# Patient Record
Sex: Female | Born: 1980 | Race: Black or African American | Hispanic: No | Marital: Married | Smoking: Current every day smoker
Health system: Southern US, Community
[De-identification: ages and names within clinical notes are randomized; demographics above are authoritative.]

## PROBLEM LIST (undated history)

## (undated) DIAGNOSIS — M79601 Pain in right arm: Secondary | ICD-10-CM

## (undated) DIAGNOSIS — K219 Gastro-esophageal reflux disease without esophagitis: Secondary | ICD-10-CM

## (undated) DIAGNOSIS — R51 Headache: Secondary | ICD-10-CM

## (undated) DIAGNOSIS — M5410 Radiculopathy, site unspecified: Secondary | ICD-10-CM

## (undated) DIAGNOSIS — M5030 Other cervical disc degeneration, unspecified cervical region: Secondary | ICD-10-CM

---
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
## 2002-01-17 ENCOUNTER — Other Ambulatory Visit: Admission: RE | Admit: 2002-01-17 | Discharge: 2002-01-17 | Payer: Self-pay | Admitting: Obstetrics and Gynecology

## 2002-02-20 ENCOUNTER — Encounter: Payer: Self-pay | Admitting: Obstetrics and Gynecology

## 2002-02-20 ENCOUNTER — Inpatient Hospital Stay (HOSPITAL_COMMUNITY): Admission: AD | Admit: 2002-02-20 | Discharge: 2002-02-22 | Payer: Self-pay | Admitting: Obstetrics and Gynecology

## 2002-02-21 ENCOUNTER — Encounter (INDEPENDENT_AMBULATORY_CARE_PROVIDER_SITE_OTHER): Payer: Self-pay | Admitting: *Deleted

## 2002-04-06 ENCOUNTER — Other Ambulatory Visit: Admission: RE | Admit: 2002-04-06 | Discharge: 2002-04-06 | Payer: Self-pay | Admitting: Obstetrics and Gynecology

## 2002-04-16 ENCOUNTER — Encounter: Payer: Self-pay | Admitting: Emergency Medicine

## 2002-04-16 ENCOUNTER — Emergency Department (HOSPITAL_COMMUNITY): Admission: EM | Admit: 2002-04-16 | Discharge: 2002-04-16 | Payer: Self-pay | Admitting: Emergency Medicine

## 2002-06-11 ENCOUNTER — Encounter: Payer: Self-pay | Admitting: Obstetrics and Gynecology

## 2002-06-11 ENCOUNTER — Inpatient Hospital Stay (HOSPITAL_COMMUNITY): Admission: AD | Admit: 2002-06-11 | Discharge: 2002-06-11 | Payer: Self-pay | Admitting: Obstetrics and Gynecology

## 2002-06-12 ENCOUNTER — Ambulatory Visit (HOSPITAL_COMMUNITY): Admission: AD | Admit: 2002-06-12 | Discharge: 2002-06-12 | Payer: Self-pay | Admitting: Obstetrics and Gynecology

## 2002-06-12 ENCOUNTER — Encounter (INDEPENDENT_AMBULATORY_CARE_PROVIDER_SITE_OTHER): Payer: Self-pay | Admitting: Specialist

## 2002-07-25 ENCOUNTER — Emergency Department (HOSPITAL_COMMUNITY): Admission: EM | Admit: 2002-07-25 | Discharge: 2002-07-25 | Payer: Self-pay | Admitting: Emergency Medicine

## 2002-08-11 ENCOUNTER — Emergency Department (HOSPITAL_COMMUNITY): Admission: EM | Admit: 2002-08-11 | Discharge: 2002-08-12 | Payer: Self-pay | Admitting: Emergency Medicine

## 2002-09-18 ENCOUNTER — Emergency Department (HOSPITAL_COMMUNITY): Admission: EM | Admit: 2002-09-18 | Discharge: 2002-09-18 | Payer: Self-pay | Admitting: Emergency Medicine

## 2002-09-18 ENCOUNTER — Encounter: Payer: Self-pay | Admitting: Emergency Medicine

## 2002-12-09 ENCOUNTER — Inpatient Hospital Stay (HOSPITAL_COMMUNITY): Admission: AD | Admit: 2002-12-09 | Discharge: 2002-12-09 | Payer: Self-pay | Admitting: Obstetrics & Gynecology

## 2003-04-13 ENCOUNTER — Emergency Department (HOSPITAL_COMMUNITY): Admission: EM | Admit: 2003-04-13 | Discharge: 2003-04-13 | Payer: Self-pay | Admitting: Emergency Medicine

## 2003-04-17 ENCOUNTER — Inpatient Hospital Stay (HOSPITAL_COMMUNITY): Admission: AD | Admit: 2003-04-17 | Discharge: 2003-04-17 | Payer: Self-pay | Admitting: Obstetrics and Gynecology

## 2003-04-30 ENCOUNTER — Inpatient Hospital Stay (HOSPITAL_COMMUNITY): Admission: AD | Admit: 2003-04-30 | Discharge: 2003-04-30 | Payer: Self-pay | Admitting: Family Medicine

## 2003-05-29 ENCOUNTER — Emergency Department (HOSPITAL_COMMUNITY): Admission: EM | Admit: 2003-05-29 | Discharge: 2003-05-29 | Payer: Self-pay | Admitting: Emergency Medicine

## 2003-08-21 ENCOUNTER — Inpatient Hospital Stay (HOSPITAL_COMMUNITY): Admission: AD | Admit: 2003-08-21 | Discharge: 2003-08-22 | Payer: Self-pay | Admitting: Obstetrics and Gynecology

## 2003-08-27 ENCOUNTER — Emergency Department (HOSPITAL_COMMUNITY): Admission: EM | Admit: 2003-08-27 | Discharge: 2003-08-27 | Payer: Self-pay | Admitting: Emergency Medicine

## 2003-09-20 ENCOUNTER — Emergency Department (HOSPITAL_COMMUNITY): Admission: EM | Admit: 2003-09-20 | Discharge: 2003-09-20 | Payer: Self-pay | Admitting: Family Medicine

## 2003-10-21 ENCOUNTER — Emergency Department (HOSPITAL_COMMUNITY): Admission: EM | Admit: 2003-10-21 | Discharge: 2003-10-21 | Payer: Self-pay | Admitting: Family Medicine

## 2003-12-09 ENCOUNTER — Emergency Department (HOSPITAL_COMMUNITY): Admission: AD | Admit: 2003-12-09 | Discharge: 2003-12-09 | Payer: Self-pay | Admitting: Emergency Medicine

## 2003-12-24 ENCOUNTER — Emergency Department (HOSPITAL_COMMUNITY): Admission: EM | Admit: 2003-12-24 | Discharge: 2003-12-25 | Payer: Self-pay | Admitting: Emergency Medicine

## 2004-01-28 ENCOUNTER — Inpatient Hospital Stay (HOSPITAL_COMMUNITY): Admission: AD | Admit: 2004-01-28 | Discharge: 2004-01-28 | Payer: Self-pay | Admitting: *Deleted

## 2004-02-23 ENCOUNTER — Encounter (INDEPENDENT_AMBULATORY_CARE_PROVIDER_SITE_OTHER): Payer: Self-pay | Admitting: *Deleted

## 2004-03-02 ENCOUNTER — Ambulatory Visit: Payer: Self-pay | Admitting: Family Medicine

## 2004-06-24 ENCOUNTER — Inpatient Hospital Stay (HOSPITAL_COMMUNITY): Admission: AD | Admit: 2004-06-24 | Discharge: 2004-06-25 | Payer: Self-pay | Admitting: Obstetrics and Gynecology

## 2004-09-24 ENCOUNTER — Emergency Department (HOSPITAL_COMMUNITY): Admission: EM | Admit: 2004-09-24 | Discharge: 2004-09-25 | Payer: Self-pay | Admitting: Emergency Medicine

## 2004-10-27 ENCOUNTER — Emergency Department (HOSPITAL_COMMUNITY): Admission: EM | Admit: 2004-10-27 | Discharge: 2004-10-27 | Payer: Self-pay | Admitting: Family Medicine

## 2004-11-20 ENCOUNTER — Emergency Department (HOSPITAL_COMMUNITY): Admission: EM | Admit: 2004-11-20 | Discharge: 2004-11-20 | Payer: Self-pay | Admitting: Family Medicine

## 2005-01-01 ENCOUNTER — Emergency Department (HOSPITAL_COMMUNITY): Admission: EM | Admit: 2005-01-01 | Discharge: 2005-01-01 | Payer: Self-pay | Admitting: Emergency Medicine

## 2005-04-20 ENCOUNTER — Inpatient Hospital Stay (HOSPITAL_COMMUNITY): Admission: AD | Admit: 2005-04-20 | Discharge: 2005-04-20 | Payer: Self-pay | Admitting: Family Medicine

## 2005-07-09 ENCOUNTER — Other Ambulatory Visit: Admission: RE | Admit: 2005-07-09 | Discharge: 2005-07-09 | Payer: Self-pay | Admitting: Obstetrics and Gynecology

## 2005-07-27 ENCOUNTER — Emergency Department (HOSPITAL_COMMUNITY): Admission: EM | Admit: 2005-07-27 | Discharge: 2005-07-27 | Payer: Self-pay | Admitting: Family Medicine

## 2005-10-22 ENCOUNTER — Emergency Department (HOSPITAL_COMMUNITY): Admission: EM | Admit: 2005-10-22 | Discharge: 2005-10-22 | Payer: Self-pay | Admitting: Family Medicine

## 2006-01-01 ENCOUNTER — Emergency Department (HOSPITAL_COMMUNITY): Admission: EM | Admit: 2006-01-01 | Discharge: 2006-01-01 | Payer: Self-pay | Admitting: Family Medicine

## 2006-04-22 ENCOUNTER — Encounter (INDEPENDENT_AMBULATORY_CARE_PROVIDER_SITE_OTHER): Payer: Self-pay | Admitting: *Deleted

## 2006-09-30 ENCOUNTER — Emergency Department (HOSPITAL_COMMUNITY): Admission: EM | Admit: 2006-09-30 | Discharge: 2006-09-30 | Payer: Self-pay | Admitting: Family Medicine

## 2006-10-28 ENCOUNTER — Emergency Department (HOSPITAL_COMMUNITY): Admission: EM | Admit: 2006-10-28 | Discharge: 2006-10-28 | Payer: Self-pay | Admitting: Family Medicine

## 2007-04-24 ENCOUNTER — Ambulatory Visit: Payer: Self-pay | Admitting: Obstetrics & Gynecology

## 2007-04-24 ENCOUNTER — Inpatient Hospital Stay (HOSPITAL_COMMUNITY): Admission: AD | Admit: 2007-04-24 | Discharge: 2007-04-24 | Payer: Self-pay | Admitting: Obstetrics & Gynecology

## 2007-04-26 ENCOUNTER — Inpatient Hospital Stay (HOSPITAL_COMMUNITY): Admission: AD | Admit: 2007-04-26 | Discharge: 2007-04-26 | Payer: Self-pay | Admitting: Obstetrics & Gynecology

## 2007-05-03 ENCOUNTER — Inpatient Hospital Stay (HOSPITAL_COMMUNITY): Admission: RE | Admit: 2007-05-03 | Discharge: 2007-05-03 | Payer: Self-pay | Admitting: Obstetrics & Gynecology

## 2007-05-05 ENCOUNTER — Emergency Department (HOSPITAL_COMMUNITY): Admission: EM | Admit: 2007-05-05 | Discharge: 2007-05-05 | Payer: Self-pay | Admitting: Emergency Medicine

## 2007-05-05 ENCOUNTER — Inpatient Hospital Stay (HOSPITAL_COMMUNITY): Admission: AD | Admit: 2007-05-05 | Discharge: 2007-05-05 | Payer: Self-pay | Admitting: Obstetrics & Gynecology

## 2007-05-18 ENCOUNTER — Inpatient Hospital Stay (HOSPITAL_COMMUNITY): Admission: AD | Admit: 2007-05-18 | Discharge: 2007-05-19 | Payer: Self-pay | Admitting: Obstetrics & Gynecology

## 2007-06-15 ENCOUNTER — Ambulatory Visit (HOSPITAL_COMMUNITY): Admission: RE | Admit: 2007-06-15 | Discharge: 2007-06-15 | Payer: Self-pay | Admitting: Family Medicine

## 2007-06-15 ENCOUNTER — Ambulatory Visit: Payer: Self-pay | Admitting: Family Medicine

## 2007-06-29 ENCOUNTER — Ambulatory Visit: Payer: Self-pay | Admitting: Obstetrics & Gynecology

## 2007-07-13 ENCOUNTER — Ambulatory Visit (HOSPITAL_COMMUNITY): Admission: RE | Admit: 2007-07-13 | Discharge: 2007-07-13 | Payer: Self-pay

## 2007-07-13 ENCOUNTER — Ambulatory Visit: Payer: Self-pay | Admitting: *Deleted

## 2007-07-27 ENCOUNTER — Ambulatory Visit (HOSPITAL_COMMUNITY): Admission: RE | Admit: 2007-07-27 | Discharge: 2007-07-27 | Payer: Self-pay | Admitting: Family Medicine

## 2007-08-10 ENCOUNTER — Ambulatory Visit: Payer: Self-pay | Admitting: Obstetrics & Gynecology

## 2007-09-07 ENCOUNTER — Ambulatory Visit: Payer: Self-pay | Admitting: Obstetrics & Gynecology

## 2007-09-16 ENCOUNTER — Ambulatory Visit: Payer: Self-pay | Admitting: Advanced Practice Midwife

## 2007-09-16 ENCOUNTER — Inpatient Hospital Stay (HOSPITAL_COMMUNITY): Admission: AD | Admit: 2007-09-16 | Discharge: 2007-09-16 | Payer: Self-pay | Admitting: Family Medicine

## 2007-09-21 ENCOUNTER — Ambulatory Visit: Payer: Self-pay | Admitting: *Deleted

## 2007-10-05 ENCOUNTER — Ambulatory Visit: Payer: Self-pay | Admitting: Family Medicine

## 2007-10-06 ENCOUNTER — Ambulatory Visit (HOSPITAL_COMMUNITY): Admission: RE | Admit: 2007-10-06 | Discharge: 2007-10-06 | Payer: Self-pay | Admitting: Family Medicine

## 2007-10-13 ENCOUNTER — Ambulatory Visit (HOSPITAL_COMMUNITY): Admission: RE | Admit: 2007-10-13 | Discharge: 2007-10-13 | Payer: Self-pay | Admitting: Obstetrics & Gynecology

## 2007-10-19 ENCOUNTER — Ambulatory Visit: Payer: Self-pay | Admitting: Obstetrics & Gynecology

## 2007-10-26 ENCOUNTER — Ambulatory Visit (HOSPITAL_COMMUNITY): Admission: RE | Admit: 2007-10-26 | Discharge: 2007-10-26 | Payer: Self-pay | Admitting: Obstetrics & Gynecology

## 2007-10-26 ENCOUNTER — Ambulatory Visit: Payer: Self-pay | Admitting: Gynecology

## 2007-11-02 ENCOUNTER — Ambulatory Visit: Payer: Self-pay | Admitting: Obstetrics & Gynecology

## 2007-11-06 ENCOUNTER — Ambulatory Visit: Payer: Self-pay | Admitting: Gynecology

## 2007-11-09 ENCOUNTER — Ambulatory Visit: Payer: Self-pay | Admitting: Obstetrics & Gynecology

## 2007-11-13 ENCOUNTER — Ambulatory Visit: Payer: Self-pay | Admitting: Family Medicine

## 2007-11-16 ENCOUNTER — Ambulatory Visit: Payer: Self-pay | Admitting: Obstetrics & Gynecology

## 2007-11-20 ENCOUNTER — Ambulatory Visit: Payer: Self-pay | Admitting: Gynecology

## 2007-11-23 ENCOUNTER — Ambulatory Visit (HOSPITAL_COMMUNITY): Admission: RE | Admit: 2007-11-23 | Discharge: 2007-11-23 | Payer: Self-pay | Admitting: Obstetrics & Gynecology

## 2007-11-23 ENCOUNTER — Ambulatory Visit: Payer: Self-pay | Admitting: Obstetrics & Gynecology

## 2007-11-27 ENCOUNTER — Ambulatory Visit: Payer: Self-pay | Admitting: Obstetrics & Gynecology

## 2007-11-30 ENCOUNTER — Ambulatory Visit: Payer: Self-pay | Admitting: Obstetrics & Gynecology

## 2007-12-04 ENCOUNTER — Inpatient Hospital Stay (HOSPITAL_COMMUNITY): Admission: AD | Admit: 2007-12-04 | Discharge: 2007-12-10 | Payer: Self-pay | Admitting: Family Medicine

## 2007-12-04 ENCOUNTER — Ambulatory Visit: Payer: Self-pay | Admitting: Family Medicine

## 2007-12-04 ENCOUNTER — Ambulatory Visit: Payer: Self-pay | Admitting: Obstetrics & Gynecology

## 2007-12-05 ENCOUNTER — Encounter: Payer: Self-pay | Admitting: *Deleted

## 2007-12-06 ENCOUNTER — Encounter: Payer: Self-pay | Admitting: *Deleted

## 2007-12-07 ENCOUNTER — Ambulatory Visit: Payer: Self-pay | Admitting: Obstetrics and Gynecology

## 2007-12-11 ENCOUNTER — Inpatient Hospital Stay (HOSPITAL_COMMUNITY): Admission: AD | Admit: 2007-12-11 | Discharge: 2007-12-11 | Payer: Self-pay | Admitting: Obstetrics & Gynecology

## 2008-04-09 ENCOUNTER — Emergency Department (HOSPITAL_COMMUNITY): Admission: EM | Admit: 2008-04-09 | Discharge: 2008-04-09 | Payer: Self-pay | Admitting: Emergency Medicine

## 2008-07-27 ENCOUNTER — Emergency Department (HOSPITAL_COMMUNITY): Admission: EM | Admit: 2008-07-27 | Discharge: 2008-07-27 | Payer: Self-pay | Admitting: Emergency Medicine

## 2008-07-31 ENCOUNTER — Emergency Department (HOSPITAL_COMMUNITY): Admission: EM | Admit: 2008-07-31 | Discharge: 2008-07-31 | Payer: Self-pay | Admitting: Family Medicine

## 2008-10-11 ENCOUNTER — Emergency Department (HOSPITAL_COMMUNITY): Admission: EM | Admit: 2008-10-11 | Discharge: 2008-10-11 | Payer: Self-pay | Admitting: Emergency Medicine

## 2008-10-30 ENCOUNTER — Emergency Department (HOSPITAL_COMMUNITY): Admission: EM | Admit: 2008-10-30 | Discharge: 2008-10-30 | Payer: Self-pay | Admitting: Radiology

## 2009-02-27 ENCOUNTER — Emergency Department (HOSPITAL_COMMUNITY): Admission: EM | Admit: 2009-02-27 | Discharge: 2009-02-27 | Payer: Self-pay | Admitting: Emergency Medicine

## 2009-04-19 ENCOUNTER — Emergency Department (HOSPITAL_COMMUNITY): Admission: EM | Admit: 2009-04-19 | Discharge: 2009-04-19 | Payer: Self-pay | Admitting: Family Medicine

## 2009-06-08 ENCOUNTER — Emergency Department (HOSPITAL_COMMUNITY): Admission: EM | Admit: 2009-06-08 | Discharge: 2009-06-08 | Payer: Self-pay | Admitting: Emergency Medicine

## 2009-06-16 ENCOUNTER — Emergency Department (HOSPITAL_COMMUNITY): Admission: EM | Admit: 2009-06-16 | Discharge: 2009-06-16 | Payer: Self-pay | Admitting: Emergency Medicine

## 2009-09-29 IMAGING — US US OB TRANSVAGINAL
1 series · 14 of 18 positions shown · non-contrast
Comparison: none

CLINICAL DATA: 27-year-old female with 8-week gestation - vaginal
bleeding.

TRANSVAGINAL OB US
TECHNIQUE: transvaginal ultrasound examinations performed for
complete evaluation of the gestation as well as the maternal
uterus, adnexal regions, and pelvic cul-de-sac.

[Series 1: us ob transvaginal · 0.18mm/px · 14 of 18 slices shown]
[im 1/18]
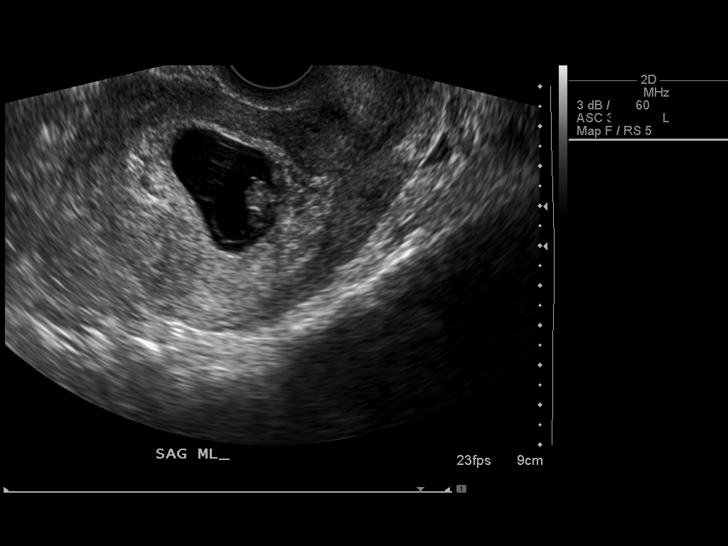
[im 2/18]
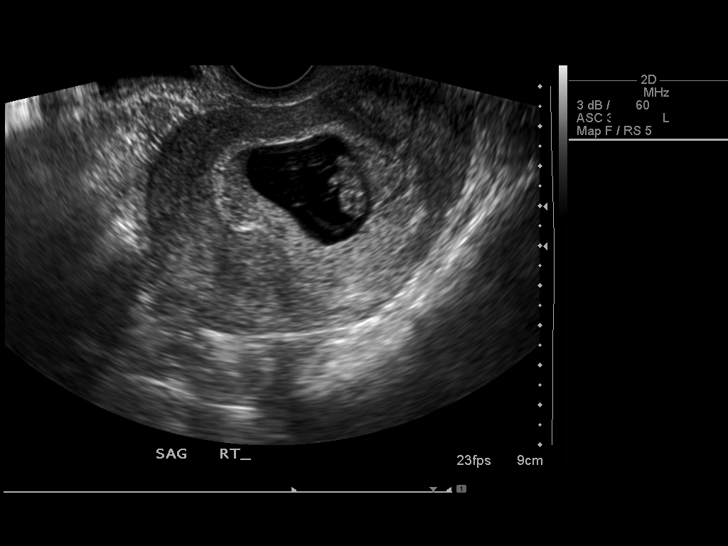
[im 4/18]
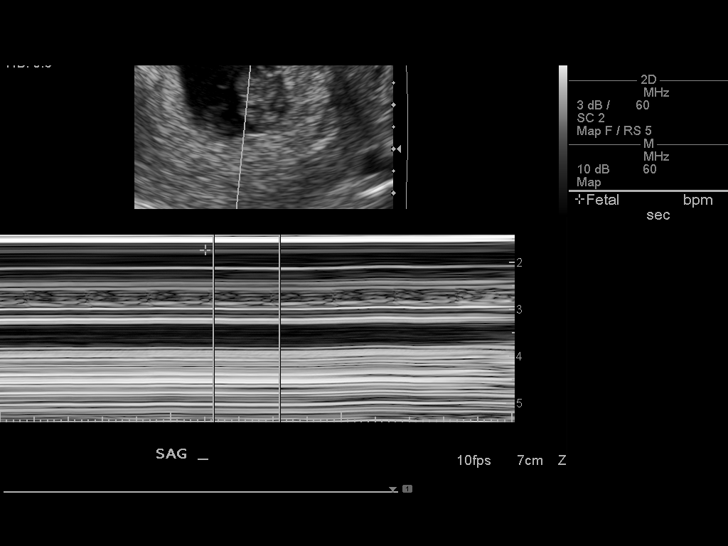
[im 5/18]
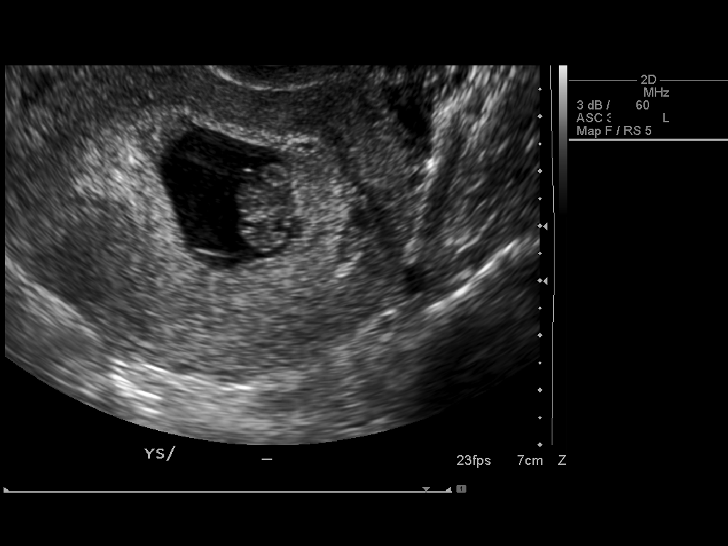
[im 6/18]
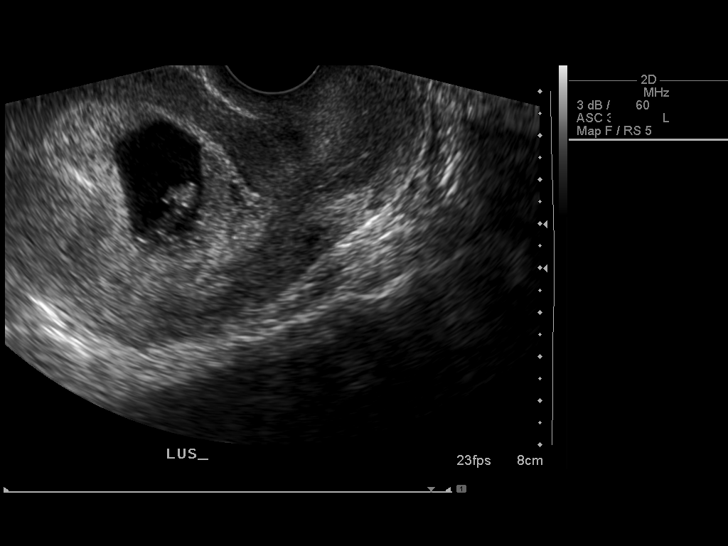
[im 8/18]
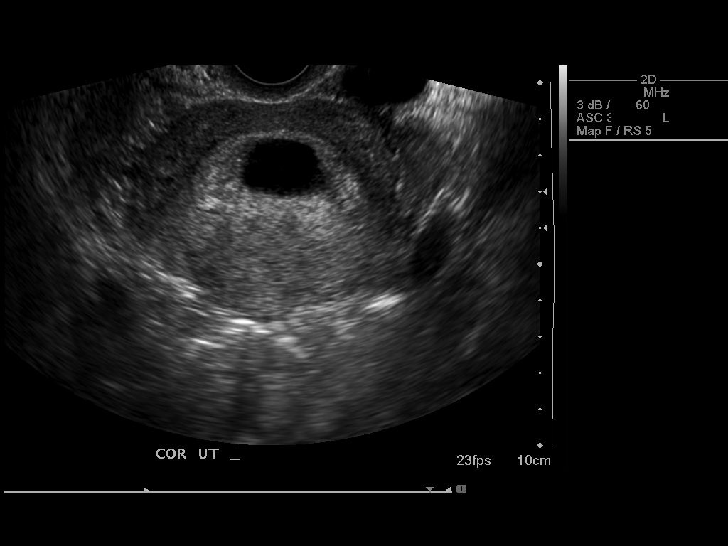
[im 9/18]
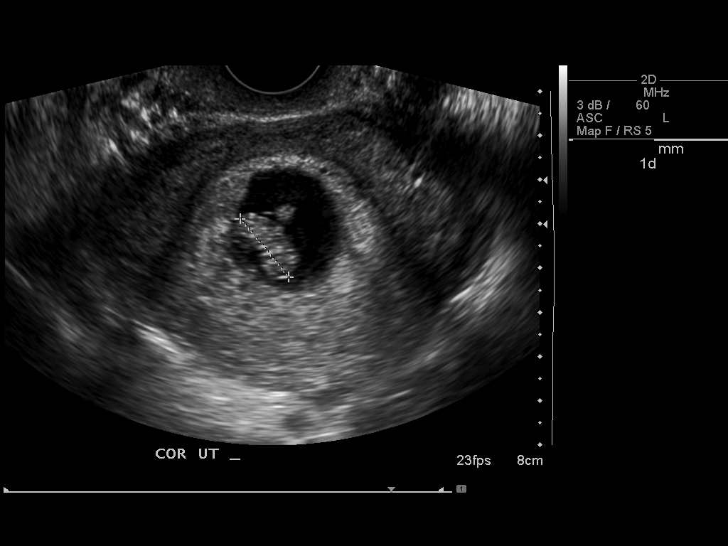
[im 10/18]
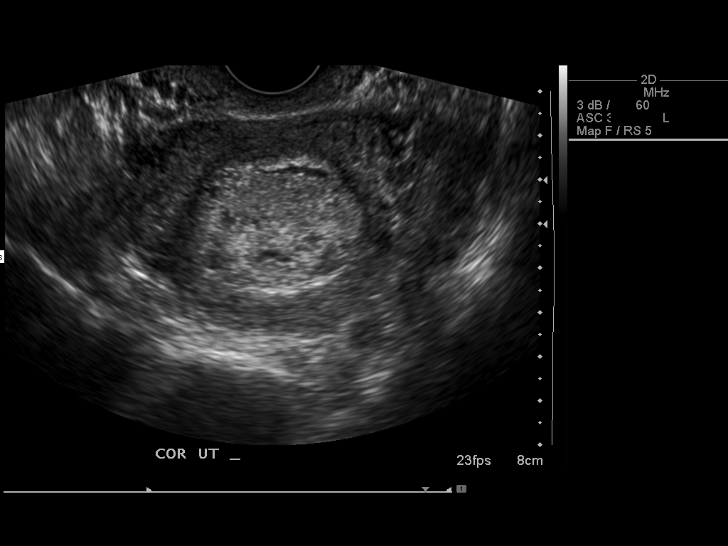
[im 11/18]
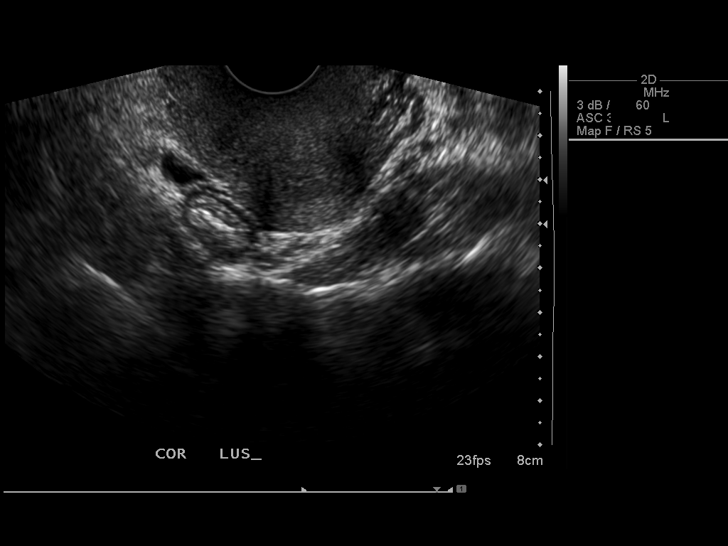
[im 13/18]
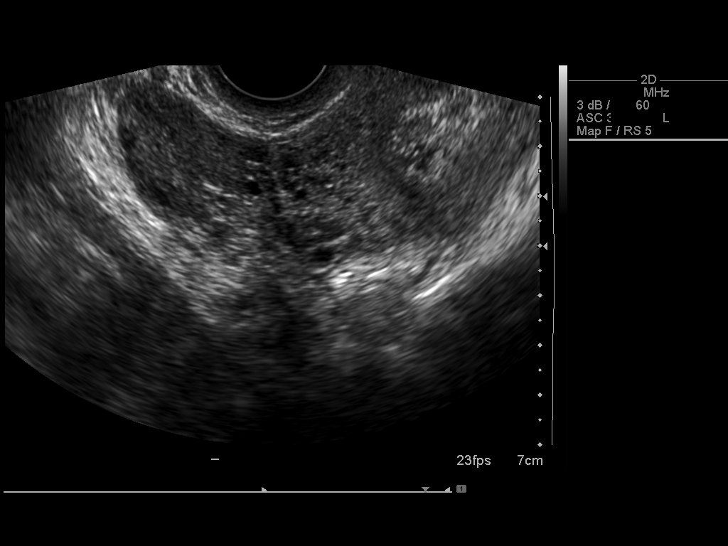
[im 14/18]
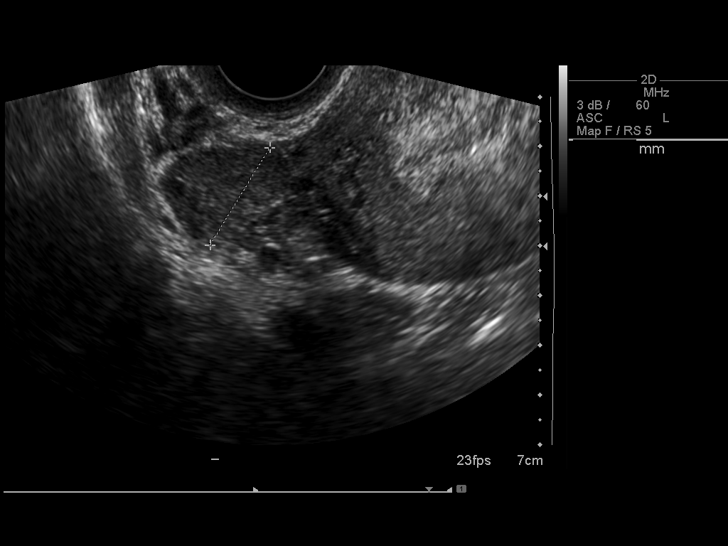
[im 15/18]
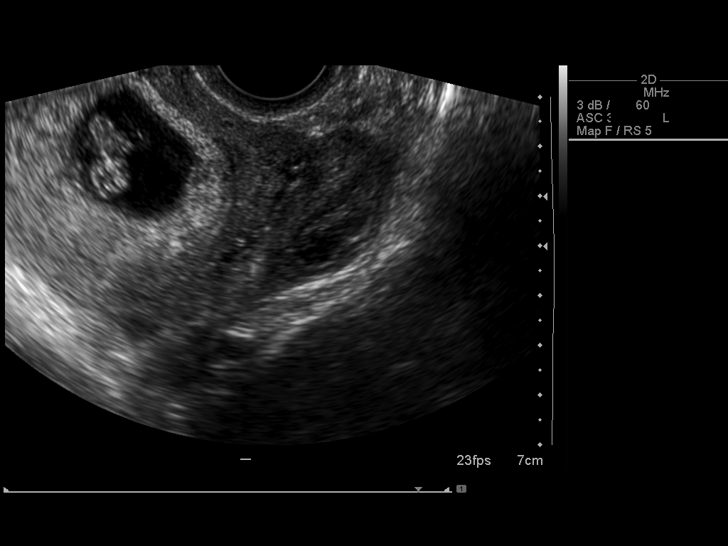
[im 17/18]
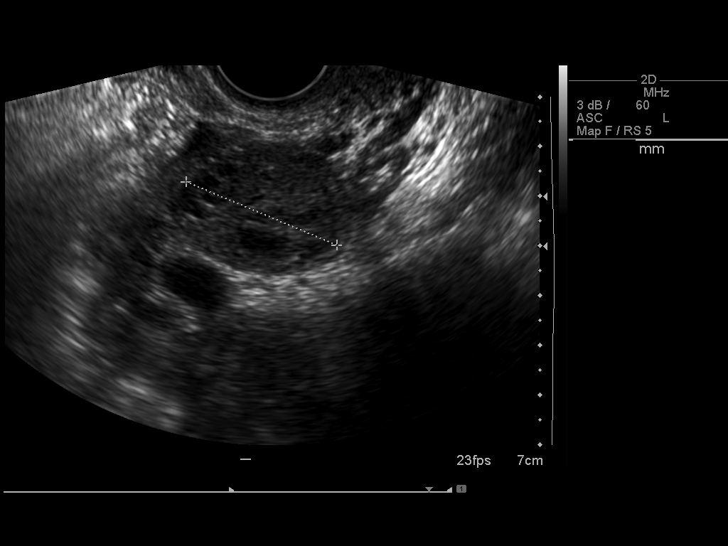
[im 18/18]
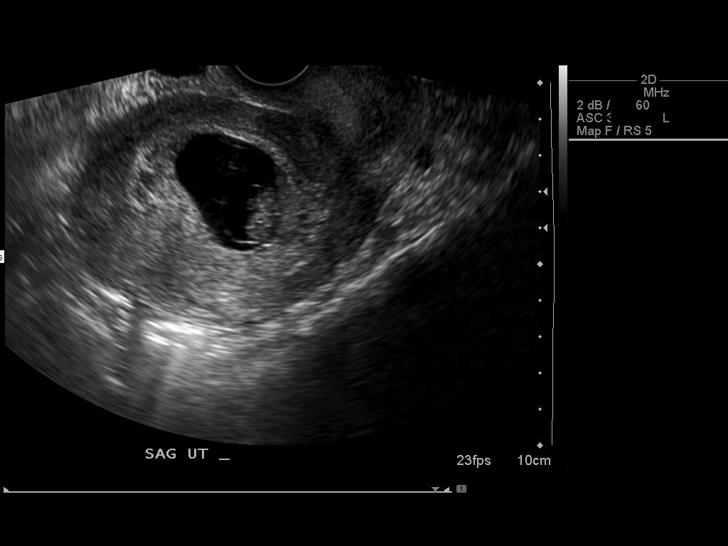

[14 of 18 positions shown; findings below may reference images not displayed]

Intrauterine gestational sac: Single
Yolk sac: Yes
Embryo: Yes
Cardiac Activity: Yes
Heart Rate: 155 bpm

CRL: 17.1 mm           8   w  1   d       - appropriate growth
since 05/03/2007.

No evidence of subchorionic hemorrhage.

Maternal uterus/adnexae:
The ovaries are unremarkable.

No evidence of adnexal mass or free fluid
IMPRESSION: Single living intrauterine gestation with estimated gestational age
of 8 weeks 1 day by this ultrasound - appropriate growth since the
prior study.

## 2009-11-01 ENCOUNTER — Ambulatory Visit: Payer: Self-pay | Admitting: Nurse Practitioner

## 2009-11-01 ENCOUNTER — Inpatient Hospital Stay (HOSPITAL_COMMUNITY): Admission: AD | Admit: 2009-11-01 | Discharge: 2009-11-01 | Payer: Self-pay | Admitting: Obstetrics & Gynecology

## 2009-12-05 ENCOUNTER — Inpatient Hospital Stay (HOSPITAL_COMMUNITY): Admission: AD | Admit: 2009-12-05 | Discharge: 2009-12-05 | Payer: Self-pay | Admitting: Obstetrics & Gynecology

## 2009-12-05 ENCOUNTER — Ambulatory Visit: Payer: Self-pay | Admitting: Obstetrics & Gynecology

## 2009-12-11 ENCOUNTER — Ambulatory Visit (HOSPITAL_COMMUNITY): Admission: RE | Admit: 2009-12-11 | Discharge: 2009-12-11 | Payer: Self-pay | Admitting: Family Medicine

## 2009-12-11 ENCOUNTER — Encounter: Payer: Self-pay | Admitting: Family Medicine

## 2009-12-25 ENCOUNTER — Ambulatory Visit: Payer: Self-pay | Admitting: Family Medicine

## 2010-01-01 ENCOUNTER — Ambulatory Visit (HOSPITAL_COMMUNITY)
Admission: RE | Admit: 2010-01-01 | Discharge: 2010-01-01 | Payer: Self-pay | Source: Home / Self Care | Admitting: Family Medicine

## 2010-01-01 ENCOUNTER — Ambulatory Visit: Payer: Self-pay | Admitting: Obstetrics & Gynecology

## 2010-01-19 ENCOUNTER — Ambulatory Visit (HOSPITAL_COMMUNITY)
Admission: RE | Admit: 2010-01-19 | Discharge: 2010-01-19 | Payer: Self-pay | Source: Home / Self Care | Admitting: Obstetrics & Gynecology

## 2010-01-29 ENCOUNTER — Ambulatory Visit: Payer: Self-pay | Admitting: Obstetrics & Gynecology

## 2010-02-18 ENCOUNTER — Ambulatory Visit (HOSPITAL_COMMUNITY)
Admission: RE | Admit: 2010-02-18 | Discharge: 2010-02-18 | Payer: Self-pay | Source: Home / Self Care | Attending: Obstetrics & Gynecology | Admitting: Obstetrics & Gynecology

## 2010-02-26 ENCOUNTER — Ambulatory Visit
Admission: RE | Admit: 2010-02-26 | Discharge: 2010-02-26 | Payer: Self-pay | Source: Home / Self Care | Attending: Obstetrics & Gynecology | Admitting: Obstetrics & Gynecology

## 2010-02-26 LAB — POCT URINALYSIS DIPSTICK
Bilirubin Urine: NEGATIVE
Hemoglobin, Urine: NEGATIVE
Ketones, ur: NEGATIVE mg/dL
Nitrite: NEGATIVE
Protein, ur: 30 mg/dL — AB
Specific Gravity, Urine: >=1.03 (ref 1.005–1.030)
Urine Glucose, Fasting: NEGATIVE mg/dL
Urobilinogen, UA: 1 mg/dL (ref 0.0–1.0)
pH: 6.5 (ref 5.0–8.0)

## 2010-03-15 ENCOUNTER — Encounter: Payer: Self-pay | Admitting: *Deleted

## 2010-03-19 ENCOUNTER — Ambulatory Visit
Admission: RE | Admit: 2010-03-19 | Discharge: 2010-03-19 | Payer: Self-pay | Source: Home / Self Care | Attending: Obstetrics and Gynecology | Admitting: Obstetrics and Gynecology

## 2010-03-19 ENCOUNTER — Encounter: Payer: Self-pay | Admitting: Obstetrics & Gynecology

## 2010-03-19 LAB — CONVERTED CEMR LAB
HCT: 36 % (ref 36.0–46.0)
MCV: 97.8 fL (ref 78.0–100.0)
Platelets: 293 10*3/uL (ref 150–400)
WBC: 11.6 10*3/uL — ABNORMAL HIGH (ref 4.0–10.5)

## 2010-03-19 LAB — POCT URINALYSIS DIPSTICK
Hgb urine dipstick: NEGATIVE
Specific Gravity, Urine: 1.02 (ref 1.005–1.030)
Urine Glucose, Fasting: NEGATIVE mg/dL
Urobilinogen, UA: 0.2 mg/dL (ref 0.0–1.0)

## 2010-04-02 ENCOUNTER — Other Ambulatory Visit: Payer: Self-pay

## 2010-04-02 DIAGNOSIS — J45909 Unspecified asthma, uncomplicated: Secondary | ICD-10-CM

## 2010-04-02 DIAGNOSIS — O09299 Supervision of pregnancy with other poor reproductive or obstetric history, unspecified trimester: Secondary | ICD-10-CM

## 2010-04-02 DIAGNOSIS — O99330 Smoking (tobacco) complicating pregnancy, unspecified trimester: Secondary | ICD-10-CM

## 2010-04-02 LAB — POCT URINALYSIS DIPSTICK
Bilirubin Urine: NEGATIVE
Hgb urine dipstick: NEGATIVE
Ketones, ur: NEGATIVE mg/dL
Nitrite: NEGATIVE
pH: 6 (ref 5.0–8.0)

## 2010-04-16 ENCOUNTER — Other Ambulatory Visit: Payer: Self-pay | Admitting: Obstetrics & Gynecology

## 2010-04-16 ENCOUNTER — Other Ambulatory Visit: Payer: Self-pay

## 2010-04-16 DIAGNOSIS — O09299 Supervision of pregnancy with other poor reproductive or obstetric history, unspecified trimester: Secondary | ICD-10-CM

## 2010-04-16 LAB — POCT URINALYSIS DIPSTICK
Hgb urine dipstick: NEGATIVE
Ketones, ur: NEGATIVE mg/dL
Protein, ur: NEGATIVE mg/dL
Specific Gravity, Urine: 1.025 (ref 1.005–1.030)
pH: 6.5 (ref 5.0–8.0)

## 2010-04-17 ENCOUNTER — Ambulatory Visit (HOSPITAL_COMMUNITY)
Admission: RE | Admit: 2010-04-17 | Discharge: 2010-04-17 | Disposition: A | Discharge status: Home / Self Care | Payer: Medicaid Other | Source: Ambulatory Visit | Attending: Obstetrics & Gynecology | Admitting: Obstetrics & Gynecology

## 2010-04-17 DIAGNOSIS — O09299 Supervision of pregnancy with other poor reproductive or obstetric history, unspecified trimester: Secondary | ICD-10-CM

## 2010-04-17 DIAGNOSIS — O9989 Other specified diseases and conditions complicating pregnancy, childbirth and the puerperium: Secondary | ICD-10-CM | POA: Insufficient documentation

## 2010-04-17 DIAGNOSIS — O99330 Smoking (tobacco) complicating pregnancy, unspecified trimester: Secondary | ICD-10-CM | POA: Insufficient documentation

## 2010-04-17 DIAGNOSIS — J45909 Unspecified asthma, uncomplicated: Secondary | ICD-10-CM | POA: Insufficient documentation

## 2010-04-23 ENCOUNTER — Other Ambulatory Visit: Payer: Self-pay

## 2010-04-23 DIAGNOSIS — O09299 Supervision of pregnancy with other poor reproductive or obstetric history, unspecified trimester: Secondary | ICD-10-CM

## 2010-04-23 LAB — POCT URINALYSIS DIPSTICK
Hgb urine dipstick: NEGATIVE
Protein, ur: NEGATIVE mg/dL
Specific Gravity, Urine: 1.025 (ref 1.005–1.030)
Urobilinogen, UA: 0.2 mg/dL (ref 0.0–1.0)
pH: 6.5 (ref 5.0–8.0)

## 2010-04-30 ENCOUNTER — Other Ambulatory Visit: Payer: Self-pay

## 2010-04-30 DIAGNOSIS — O368190 Decreased fetal movements, unspecified trimester, not applicable or unspecified: Secondary | ICD-10-CM

## 2010-04-30 DIAGNOSIS — O09299 Supervision of pregnancy with other poor reproductive or obstetric history, unspecified trimester: Secondary | ICD-10-CM

## 2010-04-30 DIAGNOSIS — O99330 Smoking (tobacco) complicating pregnancy, unspecified trimester: Secondary | ICD-10-CM

## 2010-04-30 DIAGNOSIS — J45909 Unspecified asthma, uncomplicated: Secondary | ICD-10-CM

## 2010-04-30 LAB — POCT URINALYSIS DIPSTICK
Hgb urine dipstick: NEGATIVE
Nitrite: NEGATIVE
Urobilinogen, UA: 1 mg/dL (ref 0.0–1.0)
pH: 6.5 (ref 5.0–8.0)

## 2010-05-04 ENCOUNTER — Other Ambulatory Visit: Payer: Medicaid Other

## 2010-05-04 DIAGNOSIS — O09299 Supervision of pregnancy with other poor reproductive or obstetric history, unspecified trimester: Secondary | ICD-10-CM

## 2010-05-04 LAB — POCT URINALYSIS DIPSTICK
Hgb urine dipstick: NEGATIVE
Ketones, ur: NEGATIVE mg/dL
Nitrite: NEGATIVE
Protein, ur: NEGATIVE mg/dL
pH: 6.5 (ref 5.0–8.0)

## 2010-05-05 LAB — POCT URINALYSIS DIPSTICK
Bilirubin Urine: NEGATIVE
Glucose, UA: NEGATIVE mg/dL
Glucose, UA: NEGATIVE mg/dL
Hgb urine dipstick: NEGATIVE
Hgb urine dipstick: NEGATIVE
Ketones, ur: NEGATIVE mg/dL
Nitrite: NEGATIVE
Nitrite: NEGATIVE
Protein, ur: NEGATIVE mg/dL
Protein, ur: NEGATIVE mg/dL
Specific Gravity, Urine: 1.01 (ref 1.005–1.030)
Urobilinogen, UA: 0.2 mg/dL (ref 0.0–1.0)
Urobilinogen, UA: 0.2 mg/dL (ref 0.0–1.0)
pH: 6 (ref 5.0–8.0)

## 2010-05-06 LAB — URINALYSIS, ROUTINE W REFLEX MICROSCOPIC
Bilirubin Urine: NEGATIVE
Glucose, UA: NEGATIVE mg/dL
Ketones, ur: NEGATIVE mg/dL
Protein, ur: NEGATIVE mg/dL
Urobilinogen, UA: 0.2 mg/dL (ref 0.0–1.0)

## 2010-05-06 LAB — URINE MICROSCOPIC-ADD ON

## 2010-05-06 LAB — URINE CULTURE

## 2010-05-07 ENCOUNTER — Other Ambulatory Visit: Payer: Self-pay

## 2010-05-07 DIAGNOSIS — O09299 Supervision of pregnancy with other poor reproductive or obstetric history, unspecified trimester: Secondary | ICD-10-CM

## 2010-05-07 LAB — ABO/RH: ABO/RH(D): O POS

## 2010-05-07 LAB — URINALYSIS, ROUTINE W REFLEX MICROSCOPIC
Bilirubin Urine: NEGATIVE
Glucose, UA: NEGATIVE mg/dL
Hgb urine dipstick: NEGATIVE
Specific Gravity, Urine: 1.02 (ref 1.005–1.030)
Urobilinogen, UA: 0.2 mg/dL (ref 0.0–1.0)
pH: 6 (ref 5.0–8.0)

## 2010-05-07 LAB — POCT URINALYSIS DIPSTICK
Bilirubin Urine: NEGATIVE
Glucose, UA: NEGATIVE mg/dL
Hgb urine dipstick: NEGATIVE
Specific Gravity, Urine: 1.02 (ref 1.005–1.030)
pH: 7 (ref 5.0–8.0)

## 2010-05-07 LAB — CBC
HCT: 38.9 % (ref 36.0–46.0)
Hemoglobin: 13.2 g/dL (ref 12.0–15.0)
RBC: 3.94 MIL/uL (ref 3.87–5.11)
RDW: 12.9 % (ref 11.5–15.5)
WBC: 11.4 10*3/uL — ABNORMAL HIGH (ref 4.0–10.5)

## 2010-05-07 LAB — GC/CHLAMYDIA PROBE AMP, GENITAL
Chlamydia, DNA Probe: NEGATIVE
GC Probe Amp, Genital: NEGATIVE

## 2010-05-07 LAB — POCT PREGNANCY, URINE: Preg Test, Ur: POSITIVE

## 2010-05-07 LAB — WET PREP, GENITAL: Trich, Wet Prep: NONE SEEN

## 2010-05-11 ENCOUNTER — Other Ambulatory Visit: Payer: Medicaid Other

## 2010-05-11 DIAGNOSIS — O09299 Supervision of pregnancy with other poor reproductive or obstetric history, unspecified trimester: Secondary | ICD-10-CM

## 2010-05-12 LAB — POCT URINALYSIS DIP (DEVICE)
Bilirubin Urine: NEGATIVE
Hgb urine dipstick: NEGATIVE
Nitrite: NEGATIVE
Protein, ur: NEGATIVE mg/dL
pH: 7 (ref 5.0–8.0)

## 2010-05-12 LAB — GC/CHLAMYDIA PROBE AMP, GENITAL: Chlamydia, DNA Probe: NEGATIVE

## 2010-05-12 LAB — URINALYSIS, ROUTINE W REFLEX MICROSCOPIC
Bilirubin Urine: NEGATIVE
Nitrite: NEGATIVE
Specific Gravity, Urine: 1.029 (ref 1.005–1.030)
Urobilinogen, UA: 1 mg/dL (ref 0.0–1.0)

## 2010-05-12 LAB — WET PREP, GENITAL: Yeast Wet Prep HPF POC: NONE SEEN

## 2010-05-14 ENCOUNTER — Other Ambulatory Visit: Payer: Self-pay | Admitting: Obstetrics and Gynecology

## 2010-05-14 ENCOUNTER — Other Ambulatory Visit: Payer: Self-pay | Admitting: Obstetrics & Gynecology

## 2010-05-14 ENCOUNTER — Other Ambulatory Visit: Payer: Medicaid Other

## 2010-05-14 DIAGNOSIS — J45909 Unspecified asthma, uncomplicated: Secondary | ICD-10-CM

## 2010-05-14 DIAGNOSIS — O99330 Smoking (tobacco) complicating pregnancy, unspecified trimester: Secondary | ICD-10-CM

## 2010-05-14 DIAGNOSIS — O09299 Supervision of pregnancy with other poor reproductive or obstetric history, unspecified trimester: Secondary | ICD-10-CM

## 2010-05-14 DIAGNOSIS — Z331 Pregnant state, incidental: Secondary | ICD-10-CM

## 2010-05-14 LAB — POCT URINALYSIS DIP (DEVICE)
Nitrite: NEGATIVE
Protein, ur: NEGATIVE mg/dL
pH: 7 (ref 5.0–8.0)

## 2010-05-18 ENCOUNTER — Other Ambulatory Visit: Payer: Medicaid Other

## 2010-05-18 ENCOUNTER — Ambulatory Visit (HOSPITAL_COMMUNITY)
Admission: RE | Admit: 2010-05-18 | Discharge: 2010-05-18 | Disposition: A | Discharge status: Home / Self Care | Payer: Medicaid Other | Source: Ambulatory Visit | Attending: Obstetrics & Gynecology | Admitting: Obstetrics & Gynecology

## 2010-05-18 DIAGNOSIS — O09299 Supervision of pregnancy with other poor reproductive or obstetric history, unspecified trimester: Secondary | ICD-10-CM | POA: Insufficient documentation

## 2010-05-18 DIAGNOSIS — O364XX0 Maternal care for intrauterine death, not applicable or unspecified: Secondary | ICD-10-CM

## 2010-05-18 DIAGNOSIS — Z3689 Encounter for other specified antenatal screening: Secondary | ICD-10-CM | POA: Insufficient documentation

## 2010-05-21 ENCOUNTER — Encounter: Payer: Self-pay | Admitting: Family Medicine

## 2010-05-21 ENCOUNTER — Other Ambulatory Visit: Payer: Self-pay | Admitting: Family Medicine

## 2010-05-21 DIAGNOSIS — O09299 Supervision of pregnancy with other poor reproductive or obstetric history, unspecified trimester: Secondary | ICD-10-CM

## 2010-05-21 LAB — POCT URINALYSIS DIP (DEVICE)
Nitrite: NEGATIVE
Protein, ur: NEGATIVE mg/dL
Urobilinogen, UA: 1 mg/dL (ref 0.0–1.0)

## 2010-05-21 LAB — CONVERTED CEMR LAB: Chlamydia, DNA Probe: NEGATIVE

## 2010-05-22 ENCOUNTER — Encounter: Payer: Self-pay | Admitting: Family Medicine

## 2010-05-25 ENCOUNTER — Other Ambulatory Visit: Payer: Medicaid Other

## 2010-05-25 DIAGNOSIS — O09299 Supervision of pregnancy with other poor reproductive or obstetric history, unspecified trimester: Secondary | ICD-10-CM

## 2010-05-28 ENCOUNTER — Other Ambulatory Visit: Payer: Self-pay | Admitting: Obstetrics and Gynecology

## 2010-05-28 ENCOUNTER — Other Ambulatory Visit: Payer: Medicaid Other

## 2010-05-28 DIAGNOSIS — O09299 Supervision of pregnancy with other poor reproductive or obstetric history, unspecified trimester: Secondary | ICD-10-CM

## 2010-05-28 LAB — POCT URINALYSIS DIP (DEVICE)
Ketones, ur: NEGATIVE mg/dL
Nitrite: NEGATIVE
Protein, ur: NEGATIVE mg/dL
pH: 7 (ref 5.0–8.0)

## 2010-05-29 LAB — POCT PREGNANCY, URINE: Preg Test, Ur: NEGATIVE

## 2010-05-29 LAB — POCT URINALYSIS DIP (DEVICE)
Protein, ur: NEGATIVE mg/dL
Specific Gravity, Urine: 1.02 (ref 1.005–1.030)
Urobilinogen, UA: 1 mg/dL (ref 0.0–1.0)

## 2010-05-30 LAB — WET PREP, GENITAL
Trich, Wet Prep: NONE SEEN
WBC, Wet Prep HPF POC: NONE SEEN

## 2010-05-30 LAB — POCT URINALYSIS DIP (DEVICE)
Protein, ur: NEGATIVE mg/dL
Specific Gravity, Urine: 1.02 (ref 1.005–1.030)
Urobilinogen, UA: 1 mg/dL (ref 0.0–1.0)

## 2010-05-30 LAB — POCT PREGNANCY, URINE: Preg Test, Ur: NEGATIVE

## 2010-06-01 ENCOUNTER — Other Ambulatory Visit: Payer: Medicaid Other

## 2010-06-01 DIAGNOSIS — O09299 Supervision of pregnancy with other poor reproductive or obstetric history, unspecified trimester: Secondary | ICD-10-CM

## 2010-06-01 LAB — POCT PREGNANCY, URINE: Preg Test, Ur: NEGATIVE

## 2010-06-01 LAB — GC/CHLAMYDIA PROBE AMP, GENITAL: GC Probe Amp, Genital: NEGATIVE

## 2010-06-01 LAB — WET PREP, GENITAL: Trich, Wet Prep: NONE SEEN

## 2010-06-01 LAB — POCT RAPID STREP A (OFFICE): Streptococcus, Group A Screen (Direct): NEGATIVE

## 2010-06-04 ENCOUNTER — Other Ambulatory Visit: Payer: Medicaid Other

## 2010-06-04 ENCOUNTER — Other Ambulatory Visit: Payer: Self-pay | Admitting: Obstetrics & Gynecology

## 2010-06-04 DIAGNOSIS — O09299 Supervision of pregnancy with other poor reproductive or obstetric history, unspecified trimester: Secondary | ICD-10-CM

## 2010-06-04 DIAGNOSIS — O4100X0 Oligohydramnios, unspecified trimester, not applicable or unspecified: Secondary | ICD-10-CM

## 2010-06-04 LAB — POCT URINALYSIS DIP (DEVICE)
Glucose, UA: NEGATIVE mg/dL
Protein, ur: NEGATIVE mg/dL
pH: 6.5 (ref 5.0–8.0)

## 2010-06-08 ENCOUNTER — Other Ambulatory Visit: Payer: Medicaid Other

## 2010-06-08 DIAGNOSIS — O09299 Supervision of pregnancy with other poor reproductive or obstetric history, unspecified trimester: Secondary | ICD-10-CM

## 2010-06-11 ENCOUNTER — Other Ambulatory Visit: Payer: Self-pay | Admitting: Obstetrics & Gynecology

## 2010-06-11 ENCOUNTER — Other Ambulatory Visit: Payer: Medicaid Other

## 2010-06-11 DIAGNOSIS — Z331 Pregnant state, incidental: Secondary | ICD-10-CM

## 2010-06-11 DIAGNOSIS — O09299 Supervision of pregnancy with other poor reproductive or obstetric history, unspecified trimester: Secondary | ICD-10-CM

## 2010-06-11 LAB — POCT URINALYSIS DIP (DEVICE)
Protein, ur: NEGATIVE mg/dL
Specific Gravity, Urine: 1.02 (ref 1.005–1.030)
Urobilinogen, UA: 0.2 mg/dL (ref 0.0–1.0)
pH: 6.5 (ref 5.0–8.0)

## 2010-06-15 ENCOUNTER — Other Ambulatory Visit: Payer: Medicaid Other

## 2010-06-15 DIAGNOSIS — Z331 Pregnant state, incidental: Secondary | ICD-10-CM

## 2010-06-15 DIAGNOSIS — O09299 Supervision of pregnancy with other poor reproductive or obstetric history, unspecified trimester: Secondary | ICD-10-CM

## 2010-06-18 ENCOUNTER — Encounter (HOSPITAL_COMMUNITY): Payer: Self-pay | Admitting: *Deleted

## 2010-06-18 ENCOUNTER — Inpatient Hospital Stay (HOSPITAL_COMMUNITY)
Admission: AD | Admit: 2010-06-18 | Discharge: 2010-06-21 | DRG: 775 | Disposition: A | Discharge status: Home / Self Care | Payer: Medicaid Other | Source: Ambulatory Visit | Attending: Obstetrics & Gynecology | Admitting: Obstetrics & Gynecology

## 2010-06-18 LAB — CBC
MCH: 31.6 pg (ref 26.0–34.0)
MCHC: 33.2 g/dL (ref 30.0–36.0)
MCV: 95.1 fL (ref 78.0–100.0)
Platelets: 231 10*3/uL (ref 150–400)
RDW: 13.5 % (ref 11.5–15.5)
WBC: 9.8 10*3/uL (ref 4.0–10.5)

## 2010-06-20 LAB — CBC
MCH: 31.6 pg (ref 26.0–34.0)
MCHC: 32.9 g/dL (ref 30.0–36.0)
Platelets: 223 10*3/uL (ref 150–400)
RBC: 3.7 MIL/uL — ABNORMAL LOW (ref 3.87–5.11)
RDW: 13.8 % (ref 11.5–15.5)

## 2010-07-07 NOTE — Discharge Summary (Signed)
NAME:  Emma Walker, Emma Walker NO.:  [id number]   MEDICAL RECORD NO.:  [id number]          PATIENT TYPE:  INP   LOCATION:  9109                          [facility]   PHYSICIAN:  Tilda Burrow, M.D. DATE OF BIRTH:  1981-01-31   DATE OF ADMISSION:  12/04/2007  DATE OF DISCHARGE:  12/10/2007                               DISCHARGE SUMMARY   ADMITTING DIAGNOSES:  Pregnancy at 36.[redacted] weeks gestational age; history  of intrauterine fetal demise with prior pregnancy; and nonreactive  nonstress test with biophysical profile, 6/10.   DISCHARGE DIAGNOSIS:  Pregnancy at 37.0 weeks delivered.   PROCEDURES:  Daily biophysical profile testing on December 04, 2007,  December 05, 2007, and December 06, 2007.    Foley bulb cervical ripening on December 07, 2007.  Cytotec cervical  ripening on December 08, 2007.  Foley bulb placement on December 08, 2007, 3 p.m. spontaneous vertex,  vaginal delivery, female, Apgars 8 and 9.   DISCHARGE MEDICATIONS:  Micronor, Darvocet, and Motrin.   HOSPITAL SUMMARY:  A 30 year old gravida 3, para 0-1-0-0, who was  admitted at 36 weeks 4 days due to nonreactive NST followed by  biophysical followed reading 6/10.  The patient was kept in-house for  the remaining 3 days of pregnancy before induction at 37 weeks.  Cervical ripening with Cytotec was followed by Foley bulb placement and  subsequent induction of labor using oxytocin with successful vaginal  delivery accomplished.   Postpartum course was notable for blood type O positive, antibody screen  negative, the patient was breast-feeding with Micronor contraception.  Infant's circumcision was planned as an outpatient and followup with  routine surgical postpartum instructions given upon discharge.       Tilda Burrow, M.D.  Electronically Signed     JVF/MEDQ  D:  01/11/2008  T:  01/11/2008  Job:  [id number]

## 2010-07-07 NOTE — Discharge Summary (Signed)
NAME:  Emma Walker, Emma Walker NO.:  [id number]   MEDICAL RECORD NO.:  [id number]         PATIENT TYPE:  WINP   LOCATION:                                [facility]   PHYSICIAN:  Tanya S. Shawnie Pons, M.D.   DATE OF BIRTH:  [birth year]   DATE OF ADMISSION:  DATE OF DISCHARGE:  12/10/2007                               DISCHARGE SUMMARY   ADMITTING DIAGNOSES:  1. Intrauterine pregnancy at 36-4/7th weeks.  2. Biophysical profile 6/10.   DISCHARGE DIAGNOSIS:  Delivery at 37-2/7th weeks.   HOSPITAL COURSE:  [name] is a 30 year old, gravida 3, para 0-1-1-0,  who presented at 36-4/7th weeks with a nonreassuring NST or nonreactive  NST with BPP 6/10.  Additionally, she had a history of IUFD at 36 weeks  with her previous pregnancy.  [facility].  Most recent ultrasound had showed estimated  fetal weight at the 67th percentile with normal fluid.  Cephalic  presentation per consultation with Maternal Fetal Medicine.  It was  recommended that the patient undergo continuous monitoring and repeat  BPP in 6 hours and then to possibly induce and deliver at 37 weeks'  gestation.  On December 05, 2007, BPP continued to be 6/8 and NST  remained nonreactive.  By December 06, 2007, it was recommended that the  patient undergo induction of the Foley bulb.  Cytotec ended up being the  method of cervical ripening, deemed best for the patient that was placed  on December 07, 2007, followed by Foley bulb later in the evening on the  December 07, 2007.  That Foley bulb fell out by the December 08, 2007 in  the morning.  Membranes remained intact.  The patient was 5 cm at that  time.  Fetal heart rate tracing was reassuring.  Overall, the patient's  vital signs were stable.  The Pitocin was started.  The patient  continued to progress in labor and delivered a viable female infant at  approximately 2100 on December 08, 2007.  Apgars were 8 and 9.   Nuchal  cord x2 was reduced at delivery.  Infant was doing well and taken to the  full term nursery.  The patient did not require a perineal repair.  She  was taken to the Mother Baby Unit and was doing well.  By postpartum day  #1, her vital signs were stable.  She was breast-feeding.  By postpartum  day #2, she continued to do well.  Breast feeding was going well.  She  was deemed to have received the full benefit of her hospital stay and  she was discharged home.   DISCHARGE INSTRUCTIONS:  Per postpartum handout.   DISCHARGE MEDICATIONS:  1. Motrin 600 mg 1 p.o. q.6 h. p.r.n. pain.  2. Micronor 1 p.o. daily.  3. Darvocet-N 100 1-2 p.o. q.4-6 h. p.r.n. pain.   DISCHARGE FOLLOWUP:  Discharge follow up will occur at [facility] in 6 weeks or as needed.      Cam Hai, C.N.M.  Shelbie Proctor. Shawnie Pons, M.D.  Electronically Signed    KS/MEDQ  D:  01/20/2008  T:  01/20/2008  Job:  [id number]
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
## 2010-07-10 NOTE — Discharge Summary (Signed)
NAME:  Emma Walker, Emma Walker                      ACCOUNT NO.:  [id number]   MEDICAL RECORD NO.:  [id number]                   PATIENT TYPE:  INP   LOCATION:  9310                                 [facility]   PHYSICIAN:  Hal Morales, M.D.             DATE OF BIRTH:  September 02, 1980   DATE OF ADMISSION:  02/20/2002  DATE OF DISCHARGE:  02/22/2002                                 DISCHARGE SUMMARY   ADMISSION DIAGNOSIS:  Intrauterine pregnancy with fetal demise at 37 and  five-sevenths weeks.   DISCHARGE DIAGNOSES:  1. Intrauterine pregnancy with fetal demise at 68 and five-sevenths weeks.  2. Status post normal spontaneous vaginal delivery.  3. Desiring oral contraceptives for contraception.   PROCEDURES THIS ADMISSION:  Normal spontaneous vaginal delivery of a  deceased female infant named Janiejia with no obvious congenital anomalies,  a three-vessel cord, and no nuchal cord but a cord wrapped several times  around the infant's right foot.  The infant was delivered by Dr. Osborn Coho on February 21, 2002.  The infant weighed 4 pounds 5 ounces.   HOSPITAL COURSE:  The patient is a 30 year old single black female gravida 1  para 0 at 42 and five-sevenths weeks who presented initially on February 20, 2002 complaining of no fetal movement throughout the day.  She, at that  evaluation, was found to have no fetal heart rate by cardio or formal  ultrasound and was diagnosed with intrauterine fetal demise.  She was then  recommended to proceed with an induction of labor to which she agreed and  underwent the same, and delivered spontaneously on February 21, 2002 at  approximately 3 p.m. a nonviable female infant named Derenda Fennel, attended and  delivered by Dr. Osborn Coho.  There were no obvious congenital anomalies  noted.  There was a cord wrapped several times around the infant's right  foot but no other nuchal cord.  Placenta was sent to pathology and the fetus  is for  autopsy and genetic testing per the patient.  Postpartally the  patient has done well.  She is coping well emotionally and has good support  at home.  She desires to be discharged today and is deemed stable for such  discharge.  Her vital signs are stable, she is afebrile, and she is  tolerating a regular diet, ambulating and voiding without difficulty.   DISCHARGE INSTRUCTIONS:  She is to take her temperature two times a day over  the weekend and to call [facility] for greater than or equal to  100.4.  She is to call for increased bleeding also.   DISCHARGE LABORATORY DATA:  Hemoglobin 11.3, wbc count 21.0, platelets 262.   DISCHARGE MEDICATIONS:  1. Motrin 600 mg p.o. q.6h. p.r.n. for pain.  2. Ortho Tri-Cyclen one p.o. q.d. to start in about two weeks.   DISCHARGE FOLLOW-UP:  As needed at [facility] or in four  to six  weeks.  Also to follow up with genetic counseling and [facility] office will set up that appointment.     Concha Pyo. Duplantis, C.N.M.              Hal Morales, M.D.    SJD/MEDQ  D:  02/22/2002  T:  02/22/2002  Job:  [id number]

## 2010-07-10 NOTE — Op Note (Signed)
   NAME:  Emma Walker, Emma Walker                      ACCOUNT NO.:  [id number]   MEDICAL RECORD NO.:  [id number]                   PATIENT TYPE:  AMB   LOCATION:  SDC                                  [facility]   PHYSICIAN:  Osborn Coho, M.D.                DATE OF BIRTH:  07/13/1980   DATE OF PROCEDURE:  06/12/2002  DATE OF DISCHARGE:                                 OPERATIVE REPORT   PREOPERATIVE DIAGNOSES:  Missed abortion at approximately 11 weeks estimated  gestational age.   POSTOPERATIVE DIAGNOSES:  Missed abortion at approximately 11 weeks  estimated gestational age.   PROCEDURE:  Suction dilatation and curettage.   ANESTHESIA:  MAC with paracervical block (2% lidocaine approximately 10 mL.   ATTENDING:  Osborn Coho, M.D.   FLUIDS:  1000 mL.   URINE OUTPUT:  Approximately 100 mL prior to procedure via straight  catheter.   ESTIMATED BLOOD LOSS:  Minimal (less than 10 mL).   PATHOLOGY:  POCs (products of conception).   COMPLICATIONS:  None.   PROCEDURE:  The patient was taken to the operating room after the risks,  benefits, and alternatives discussed with the patient and patient verbalized  understanding and consent signed and witnessed.  The patient was placed  under MAC for anesthesia and in the dorsal lithotomy position prepped and  draped in the normal sterile fashion.  The patient was straight catheterized  with clear urine returning.  An examination under anesthesia was performed  and the uterus was approximately 10 weeks in size and cervical os was  closed.  In addition, there were no palpable adnexal masses.  A bivalve  speculum was placed in the patient's vagina and 10 mL of 2% lidocaine was  administered for a paracervical block.  A single tooth tenaculum was placed  on the anterior lip of the cervix and the cervix dilated to passage of a  size 10 suction curette.  Suction curettage was performed with significant  amount of products of conception  returning.  Sharp curettage was performed  until a gritty texture was noted.  Suction curettage was performed one last  time to remove any remaining debris.  The tenaculum was removed and good  hemostasis was noted at the tenaculum site.  The patient was not actively  bleeding.  Sponge count was correct.  The patient tolerated procedure well  and was returned to the recovery room in stable condition.                                               Osborn Coho, M.D.    AR/MEDQ  D:  06/12/2002  T:  06/12/2002  Job:  [id number]

## 2010-07-16 ENCOUNTER — Ambulatory Visit: Payer: Medicaid Other | Admitting: Advanced Practice Midwife

## 2010-07-16 ENCOUNTER — Ambulatory Visit (INDEPENDENT_AMBULATORY_CARE_PROVIDER_SITE_OTHER): Payer: Medicaid Other | Admitting: Physician Assistant

## 2010-07-17 NOTE — Group Therapy Note (Signed)
NAME:  Emma Walker, Emma Walker NO.:  [id number]  MEDICAL RECORD NO.:  [id number]           PATIENT TYPE:  A  [facility]ATION:  [facility]                   [facility]  PHYSICIAN:  Maylon Cos, CNM    DATE OF BIRTH:  11/28/1980  DATE OF SERVICE:  07/16/2010                                 CLINIC NOTE  The patient presents today to GYN Clinic at [facility].  Reason for today's visit is 4-week postpartum visit.  HISTORY OF PRESENT ILLNESS:  The patient is a 30 year old gravida 4, para 2-1-1-3, who presents 4 weeks status post spontaneous vaginal delivery of a female infant weighing 6 pounds 8 ounces at 39 weeks and 1 day.  The baby had 9 and 9 Apgars and was delivered by Dr. Lucina Mellow at [facility].  She had uneventful postpartum course and was sent home on postpartum day #2.  She presents now 4 weeks status post delivery, doing well.  She is having small amounts of brown discharge and has been the same since approximately 2 weeks postpartum. She has not resumed intercourse.  She is breastfeeding and pumping.  She had only started supplementing within the last 2-3 days and she is mixing breast milk with her formula and she is pumping when she is not nursing.  She does feel that she is getting adequate rest and nutrition. Her fiance is very supportive and feels that she is coping well with her role as mother of two.  Vital signs are stable.  Her temperature today is 97.0, her pulse is 81, her blood pressure is 110/77, her weight today is 145.7 which is 21 pounds down from her delivery.  Her height is 51 inches.  She does desire a Mirena IUD for contraception and we have discussed other options that are indicated with breastfeeding.  However, she is much interested in Mirena and plans to return within next 1-2 weeks for insertion.  PHYSICAL EXAMINATION:  GENERAL:  Emma Walker is a pleasant African American female. HEENT:  Grossly normal. VITAL SIGNS:   Stable. BREASTS:  She is nursing during the exam.  Breasts are soft and nontender.  She has excellent latch and her baby  is absolutely gorgeous. ABDOMEN:  Soft and nontender. PELVIC:  Deferred secondary to no complaints. EXTREMITIES:  Reactive and she has no edema in the lower extremities.  ASSESSMENT: 1. 4 weeks status post spontaneous delivery, doing well. 2. Breast feeding. 3. Contraceptive education.  PLAN: 1. The patient should return in 1-2 weeks for Mirena IUD insertion. 2. Continue breast feeding and prenatal vitamins or multivitamin     daily. 3. Resume activity as normal.  She has no restrictions on exercise,     lifting, etc.  The patient's [place]     postpartum screen is negative for signs of postpartum depression     and score on Edinburgh postpartum screen total is 1.          ______________________________ Maylon Cos, CNM    SS/MEDQ  D:  07/16/2010  T:  07/17/2010  Job:  [id number]

## 2010-07-30 ENCOUNTER — Ambulatory Visit: Payer: Medicaid Other | Admitting: Physician Assistant

## 2010-07-31 ENCOUNTER — Ambulatory Visit (INDEPENDENT_AMBULATORY_CARE_PROVIDER_SITE_OTHER): Payer: Medicaid Other | Admitting: Family Medicine

## 2010-07-31 ENCOUNTER — Other Ambulatory Visit: Payer: Self-pay | Admitting: Family Medicine

## 2010-07-31 DIAGNOSIS — Z30430 Encounter for insertion of intrauterine contraceptive device: Secondary | ICD-10-CM

## 2010-07-31 LAB — POCT PREGNANCY, URINE: Preg Test, Ur: NEGATIVE

## 2010-08-02 NOTE — Group Therapy Note (Signed)
NAME:  Emma Walker, Emma Walker NO.:  [id number]  MEDICAL RECORD NO.:  [id number]           PATIENT TYPE:  [facility]ATION:  [facility]                     FACILITY:  PHYSICIAN:  Lucina Mellow, DO   DATE OF BIRTH:  05-13-1980  DATE OF SERVICE:                                 CLINIC NOTE  The patient presents to the clinic today for placement of a Mirena IUD. The patient is a 30 year old, gravida 4, para 2-1-1-3 who had her 4-week postpartum visit on Jul 16, 2010.  She was seen by midwife, Maylon Cos and she decided to have a Mirena IUD placed at that time.  She presents today with no concerns of vaginal bleeding or discharge or discomfort and agrees to placement of the Mirena IUD after information was provided for counseling.  She does sign a consent and it is in the chart.  Urine pregnancy test is negative.  Vital signs include temperature of 97.7, pulse of 81, blood pressure 116/75, weight of 143.6, and height of 5 feet 1-1/2 inches tall.  The patient is placed in the dorsal lithotomy position.  A sterile speculum is placed into the vagina and the cervix sample was taken for gonorrhea and Chlamydia as well as gonorrhea, Chlamydia were done in March 2012.  At this time, a sterile sound is placed into her uterus and uterus is sounded to 8 cm. This was matched up with the Mirena IUD and the applicator is sized to 8 cm.  The Mirena IUD applicator then is inserted into the uterus without difficulty.  The IUD is fired in place without difficulty.  The strings were trimmed to approximately 2 cm outside the cervical os.  The speculum was removed.  The patient tolerated the procedure well.  The patient is given information regarding Mirena IUD and the information card was handed out to her and recommended for her to place in her wallet.  The patient will return in approximately 4-6 weeks for IUD string check and she is also educated on how she can check her strings herself.  She  will present for routine Pap testing and each time that she does that she also can have her IUD strings checked.  All of her questions are answered.  She leaves the clinic in stable condition with no discomfort or bleeding.          ______________________________ Lucina Mellow, DO    SH/MEDQ  D:  07/31/2010  T:  08/01/2010  Job:  [id number]

## 2010-08-22 ENCOUNTER — Encounter: Payer: Self-pay | Admitting: *Deleted

## 2010-09-10 ENCOUNTER — Encounter: Payer: Self-pay | Admitting: Obstetrics and Gynecology

## 2010-09-10 ENCOUNTER — Ambulatory Visit (INDEPENDENT_AMBULATORY_CARE_PROVIDER_SITE_OTHER): Payer: Medicaid Other | Admitting: Obstetrics and Gynecology

## 2010-09-10 VITALS — BP 114/83 | HR 78 | Temp 98.6°F | Wt 138.8 lb

## 2010-09-10 DIAGNOSIS — Z30431 Encounter for routine checking of intrauterine contraceptive device: Secondary | ICD-10-CM

## 2010-09-10 NOTE — Progress Notes (Signed)
The patient is a 30 year old gravida 4 para [id number] her 4 week postpartum visit had an IUD placed (Mirena(she was in today for an IUD string check approximately 3 months after the birth of her baby. I was unable to see the string and therefore was scheduled for an ultrasound examination. We will notify the patient of the exam result.

## 2010-09-11 ENCOUNTER — Ambulatory Visit (HOSPITAL_COMMUNITY): Admission: RE | Admit: 2010-09-11 | Payer: Medicaid Other | Source: Ambulatory Visit

## 2010-09-11 ENCOUNTER — Other Ambulatory Visit (HOSPITAL_COMMUNITY): Payer: Medicaid Other

## 2010-11-16 LAB — POCT URINALYSIS DIP (DEVICE)
Bilirubin Urine: NEGATIVE
Ketones, ur: NEGATIVE
Protein, ur: NEGATIVE
Specific Gravity, Urine: 1.01
pH: 7

## 2010-11-16 LAB — URINALYSIS, ROUTINE W REFLEX MICROSCOPIC
Glucose, UA: NEGATIVE
Hgb urine dipstick: NEGATIVE
Specific Gravity, Urine: 1.015
Urobilinogen, UA: 0.2

## 2010-11-16 LAB — WET PREP, GENITAL
Trich, Wet Prep: NONE SEEN
Trich, Wet Prep: NONE SEEN
Yeast Wet Prep HPF POC: NONE SEEN

## 2010-11-16 LAB — GC/CHLAMYDIA PROBE AMP, GENITAL
Chlamydia, DNA Probe: NEGATIVE
GC Probe Amp, Genital: NEGATIVE
GC Probe Amp, Genital: NEGATIVE

## 2010-11-17 LAB — POCT URINALYSIS DIP (DEVICE)
Glucose, UA: NEGATIVE
Hgb urine dipstick: NEGATIVE
Nitrite: NEGATIVE
Protein, ur: NEGATIVE
Specific Gravity, Urine: 1.02
Urobilinogen, UA: 0.2

## 2010-11-18 LAB — POCT URINALYSIS DIP (DEVICE)
Glucose, UA: NEGATIVE
Glucose, UA: NEGATIVE
Hgb urine dipstick: NEGATIVE
Hgb urine dipstick: NEGATIVE
Nitrite: NEGATIVE
Nitrite: NEGATIVE
Protein, ur: 30 — AB
Protein, ur: NEGATIVE
Specific Gravity, Urine: 1.02
Specific Gravity, Urine: <=1.005
Urobilinogen, UA: 0.2
Urobilinogen, UA: 0.2
pH: 6.5
pH: 6.5

## 2010-11-19 LAB — POCT URINALYSIS DIP (DEVICE)
Glucose, UA: NEGATIVE
Hgb urine dipstick: NEGATIVE
Nitrite: NEGATIVE
Specific Gravity, Urine: 1.015
Urobilinogen, UA: 0.2
pH: 7

## 2010-11-20 LAB — POCT URINALYSIS DIP (DEVICE)
Bilirubin Urine: NEGATIVE
Bilirubin Urine: NEGATIVE
Glucose, UA: NEGATIVE
Glucose, UA: NEGATIVE
Glucose, UA: NEGATIVE
Hgb urine dipstick: NEGATIVE
Hgb urine dipstick: NEGATIVE
Hgb urine dipstick: NEGATIVE
Nitrite: NEGATIVE
Nitrite: NEGATIVE
Nitrite: NEGATIVE
Operator id: 297281
Operator id: 159681
Specific Gravity, Urine: 1.02
Specific Gravity, Urine: 1.02
Specific Gravity, Urine: 1.025
Urobilinogen, UA: 0.2
Urobilinogen, UA: 0.2
pH: 6.5

## 2010-11-20 LAB — LUPUS ANTICOAGULANT PANEL
DRVVT: 43.6 (ref 36.1–47.0)
PTT Lupus Anticoagulant: 39.7 (ref 36.3–48.8)

## 2010-11-20 LAB — CARDIOLIPIN ANTIBODIES, IGG, IGM, IGA
Anticardiolipin IgA: 18 (ref <13)
Anticardiolipin IgG: <7 — ABNORMAL LOW (ref <11)
Anticardiolipin IgM: 16 (ref <10)

## 2010-11-20 LAB — PROTEIN C, TOTAL: Protein C, Total: 55 % — ABNORMAL LOW (ref 70–140)

## 2010-11-20 LAB — URINALYSIS, ROUTINE W REFLEX MICROSCOPIC
Bilirubin Urine: NEGATIVE
Glucose, UA: NEGATIVE
Hgb urine dipstick: NEGATIVE
Protein, ur: NEGATIVE
Specific Gravity, Urine: 1.015

## 2010-11-20 LAB — PROTHROMBIN GENE MUTATION

## 2010-11-20 LAB — BETA-2-GLYCOPROTEIN I ABS, IGG/M/A: Beta-2-Glycoprotein I IgM: 4 U/mL (ref <10)

## 2010-11-20 LAB — MISCELLANEOUS TEST

## 2010-11-20 LAB — PROTEIN C ACTIVITY: Protein C Activity: 101 % (ref 75–133)

## 2010-11-20 LAB — HOMOCYSTEINE: Homocysteine: 4.1

## 2010-11-23 LAB — POCT URINALYSIS DIP (DEVICE)
Glucose, UA: NEGATIVE
Glucose, UA: NEGATIVE
Glucose, UA: NEGATIVE
Hgb urine dipstick: NEGATIVE
Nitrite: NEGATIVE
Nitrite: NEGATIVE
Nitrite: NEGATIVE
Nitrite: NEGATIVE
Operator id: 159681
Operator id: 200901
Operator id: 194561
Operator id: 200901
Protein, ur: NEGATIVE
Protein, ur: NEGATIVE
Protein, ur: NEGATIVE
Protein, ur: NEGATIVE
Specific Gravity, Urine: 1.02
Specific Gravity, Urine: 1.02
Specific Gravity, Urine: 1.015
Specific Gravity, Urine: 1.02
Urobilinogen, UA: 1
Urobilinogen, UA: 1
Urobilinogen, UA: 0.2
Urobilinogen, UA: 1

## 2010-11-23 LAB — CBC
MCHC: 34.7
MCV: 100
Platelets: 236
WBC: 10.2

## 2010-11-23 LAB — RPR: RPR Ser Ql: NONREACTIVE

## 2010-11-25 LAB — POCT URINALYSIS DIP (DEVICE)
Nitrite: NEGATIVE
Nitrite: NEGATIVE
Operator id: 297281
Protein, ur: NEGATIVE
Protein, ur: NEGATIVE
Urobilinogen, UA: 1
pH: 6
pH: 6.5

## 2010-12-04 LAB — POCT URINALYSIS DIP (DEVICE)
Ketones, ur: NEGATIVE
Nitrite: NEGATIVE
Protein, ur: NEGATIVE
Urobilinogen, UA: 1
pH: 6.5

## 2010-12-04 LAB — WET PREP, GENITAL: Trich, Wet Prep: NONE SEEN

## 2010-12-04 LAB — GC/CHLAMYDIA PROBE AMP, GENITAL
Chlamydia, DNA Probe: NEGATIVE
GC Probe Amp, Genital: NEGATIVE

## 2010-12-16 ENCOUNTER — Inpatient Hospital Stay (INDEPENDENT_AMBULATORY_CARE_PROVIDER_SITE_OTHER)
Admission: RE | Admit: 2010-12-16 | Discharge: 2010-12-16 | Disposition: A | Discharge status: Home / Self Care | Payer: Medicaid Other | Source: Ambulatory Visit | Attending: Emergency Medicine | Admitting: Emergency Medicine

## 2010-12-16 DIAGNOSIS — S139XXA Sprain of joints and ligaments of unspecified parts of neck, initial encounter: Secondary | ICD-10-CM

## 2011-01-12 ENCOUNTER — Ambulatory Visit: Payer: Medicaid Other | Attending: Orthopedic Surgery

## 2011-01-12 DIAGNOSIS — M542 Cervicalgia: Secondary | ICD-10-CM | POA: Insufficient documentation

## 2011-01-12 DIAGNOSIS — R293 Abnormal posture: Secondary | ICD-10-CM | POA: Insufficient documentation

## 2011-01-12 DIAGNOSIS — IMO0001 Reserved for inherently not codable concepts without codable children: Secondary | ICD-10-CM | POA: Insufficient documentation

## 2011-01-12 DIAGNOSIS — M2560 Stiffness of unspecified joint, not elsewhere classified: Secondary | ICD-10-CM | POA: Insufficient documentation

## 2011-01-12 DIAGNOSIS — M25519 Pain in unspecified shoulder: Secondary | ICD-10-CM | POA: Insufficient documentation

## 2011-01-20 ENCOUNTER — Ambulatory Visit: Payer: Medicaid Other | Admitting: Physical Therapy

## 2011-01-26 ENCOUNTER — Ambulatory Visit: Payer: Medicaid Other | Attending: Orthopedic Surgery

## 2011-01-26 DIAGNOSIS — M25519 Pain in unspecified shoulder: Secondary | ICD-10-CM | POA: Insufficient documentation

## 2011-01-26 DIAGNOSIS — R293 Abnormal posture: Secondary | ICD-10-CM | POA: Insufficient documentation

## 2011-01-26 DIAGNOSIS — IMO0001 Reserved for inherently not codable concepts without codable children: Secondary | ICD-10-CM | POA: Insufficient documentation

## 2011-01-26 DIAGNOSIS — M542 Cervicalgia: Secondary | ICD-10-CM | POA: Insufficient documentation

## 2011-01-26 DIAGNOSIS — M2560 Stiffness of unspecified joint, not elsewhere classified: Secondary | ICD-10-CM | POA: Insufficient documentation

## 2011-02-02 ENCOUNTER — Ambulatory Visit: Payer: Medicaid Other | Admitting: Rehabilitation

## 2011-02-05 ENCOUNTER — Other Ambulatory Visit: Payer: Self-pay | Admitting: Orthopedic Surgery

## 2011-02-05 DIAGNOSIS — M542 Cervicalgia: Secondary | ICD-10-CM

## 2011-02-10 ENCOUNTER — Ambulatory Visit
Admission: RE | Admit: 2011-02-10 | Discharge: 2011-02-10 | Disposition: A | Discharge status: Home / Self Care | Payer: Medicaid Other | Source: Ambulatory Visit | Attending: Orthopedic Surgery | Admitting: Orthopedic Surgery

## 2011-02-10 DIAGNOSIS — M542 Cervicalgia: Secondary | ICD-10-CM

## 2011-04-06 ENCOUNTER — Other Ambulatory Visit: Payer: Self-pay | Admitting: Orthopedic Surgery

## 2011-04-06 ENCOUNTER — Encounter (HOSPITAL_COMMUNITY): Payer: Self-pay | Admitting: Pharmacy Technician

## 2011-04-12 ENCOUNTER — Encounter (HOSPITAL_COMMUNITY): Payer: Self-pay

## 2011-04-12 ENCOUNTER — Other Ambulatory Visit: Payer: Self-pay

## 2011-04-12 ENCOUNTER — Encounter (HOSPITAL_COMMUNITY)
Admission: RE | Admit: 2011-04-12 | Discharge: 2011-04-12 | Disposition: A | Discharge status: Home / Self Care | Payer: Medicaid Other | Source: Ambulatory Visit | Attending: Orthopedic Surgery | Admitting: Orthopedic Surgery

## 2011-04-12 ENCOUNTER — Ambulatory Visit (HOSPITAL_COMMUNITY)
Admission: RE | Admit: 2011-04-12 | Discharge: 2011-04-12 | Disposition: A | Discharge status: Home / Self Care | Payer: Medicaid Other | Source: Ambulatory Visit | Attending: Orthopedic Surgery | Admitting: Orthopedic Surgery

## 2011-04-12 DIAGNOSIS — Z01818 Encounter for other preprocedural examination: Secondary | ICD-10-CM | POA: Insufficient documentation

## 2011-04-12 DIAGNOSIS — Z01810 Encounter for preprocedural cardiovascular examination: Secondary | ICD-10-CM | POA: Insufficient documentation

## 2011-04-12 DIAGNOSIS — Z01811 Encounter for preprocedural respiratory examination: Secondary | ICD-10-CM | POA: Insufficient documentation

## 2011-04-12 HISTORY — DX: Pain in right arm: M79.601

## 2011-04-12 HISTORY — DX: Gastro-esophageal reflux disease without esophagitis: K21.9

## 2011-04-12 LAB — DIFFERENTIAL
Basophils Absolute: 0.1 10*3/uL (ref 0.0–0.1)
Basophils Relative: 1 % (ref 0–1)
Eosinophils Absolute: 0.3 10*3/uL (ref 0.0–0.7)
Eosinophils Relative: 5 % (ref 0–5)
Monocytes Absolute: 0.4 10*3/uL (ref 0.1–1.0)

## 2011-04-12 LAB — CBC
Hemoglobin: 13.7 g/dL (ref 12.0–15.0)
MCV: 93.6 fL (ref 78.0–100.0)
Platelets: 341 10*3/uL (ref 150–400)
RBC: 4.37 MIL/uL (ref 3.87–5.11)
WBC: 6.5 10*3/uL (ref 4.0–10.5)

## 2011-04-12 LAB — URINALYSIS, ROUTINE W REFLEX MICROSCOPIC
Glucose, UA: NEGATIVE mg/dL
Hgb urine dipstick: NEGATIVE
Specific Gravity, Urine: 1.023 (ref 1.005–1.030)
Urobilinogen, UA: 1 mg/dL (ref 0.0–1.0)
pH: 6 (ref 5.0–8.0)

## 2011-04-12 LAB — COMPREHENSIVE METABOLIC PANEL
ALT: 11 U/L (ref 0–35)
AST: 18 U/L (ref 0–37)
Alkaline Phosphatase: 85 U/L (ref 39–117)
CO2: 26 mEq/L (ref 19–32)
Chloride: 104 mEq/L (ref 96–112)
GFR calc non Af Amer: >90 mL/min (ref >90)
Glucose, Bld: 76 mg/dL (ref 70–99)
Sodium: 139 mEq/L (ref 135–145)
Total Bilirubin: 0.3 mg/dL (ref 0.3–1.2)

## 2011-04-12 LAB — SURGICAL PCR SCREEN
MRSA, PCR: NEGATIVE
Staphylococcus aureus: NEGATIVE

## 2011-04-12 LAB — TYPE AND SCREEN: Antibody Screen: NEGATIVE

## 2011-04-12 LAB — ABO/RH: ABO/RH(D): O POS

## 2011-04-12 LAB — HCG, SERUM, QUALITATIVE: Preg, Serum: NEGATIVE

## 2011-04-12 NOTE — Pre-Procedure Instructions (Signed)
20 DESIRE FULP  04/12/2011   Your procedure is scheduled on: feb 21  Report to [facility] Stay Center at 1000 AM.  Call this number if you have problems the morning of surgery: [phone]   Remember:   Do not eat food:After Midnight.  May have clear liquids: up to 4 Hours before arrival.  Clear liquids include soda, tea, black coffee, apple or grape juice, broth.  Take these medicines the morning of surgery with A SIP OF WATER: none   Do not wear jewelry, make-up or nail polish.  Do not wear lotions, powders, or perfumes. You may wear deodorant.  Do not shave 48 hours prior to surgery.  Do not bring valuables to the hospital.  Contacts, dentures or bridgework may not be worn into surgery.  Leave suitcase in the car. After surgery it may be brought to your room.  For patients admitted to the hospital, checkout time is 11:00 AM the day of discharge.   Patients discharged the day of surgery will not be allowed to drive home.  Name and phone number of your driver: mother  Special Instructions: CHG Shower Use Special Wash: 1/2 bottle night before surgery and 1/2 bottle morning of surgery.   Please read over the following fact sheets that you were given: Pain Booklet, Coughing and Deep Breathing, Blood Transfusion Information, MRSA Information and Surgical Site Infection Prevention

## 2011-04-15 ENCOUNTER — Ambulatory Visit (HOSPITAL_COMMUNITY): Payer: Medicaid Other | Admitting: Anesthesiology

## 2011-04-15 ENCOUNTER — Encounter (HOSPITAL_COMMUNITY)
Admission: RE | Disposition: A | Discharge status: Home / Self Care | Payer: Self-pay | Source: Ambulatory Visit | Attending: Orthopedic Surgery

## 2011-04-15 ENCOUNTER — Ambulatory Visit (HOSPITAL_COMMUNITY): Payer: Medicaid Other

## 2011-04-15 ENCOUNTER — Encounter (HOSPITAL_COMMUNITY): Payer: Self-pay | Admitting: Anesthesiology

## 2011-04-15 ENCOUNTER — Encounter (HOSPITAL_COMMUNITY): Payer: Self-pay | Admitting: *Deleted

## 2011-04-15 ENCOUNTER — Inpatient Hospital Stay (HOSPITAL_COMMUNITY)
Admission: RE | Admit: 2011-04-15 | Discharge: 2011-04-16 | DRG: 030 | Disposition: A | Discharge status: Home / Self Care | Payer: Medicaid Other | Source: Ambulatory Visit | Attending: Orthopedic Surgery | Admitting: Orthopedic Surgery

## 2011-04-15 DIAGNOSIS — M5412 Radiculopathy, cervical region: Principal | ICD-10-CM | POA: Diagnosis present

## 2011-04-15 DIAGNOSIS — M5410 Radiculopathy, site unspecified: Secondary | ICD-10-CM

## 2011-04-15 HISTORY — PX: ANTERIOR CERVICAL DECOMP/DISCECTOMY FUSION: SHX1161

## 2011-04-15 SURGERY — ANTERIOR CERVICAL DECOMPRESSION/DISCECTOMY FUSION 2 LEVELS
Anesthesia: General | Site: Neck | Wound class: Clean

## 2011-04-15 MED ORDER — MORPHINE SULFATE 4 MG/ML IJ SOLN
2.0000 mg | INTRAMUSCULAR | Status: DC | PRN
  Administered 2011-04-15 – 2011-04-16 (×2): 2 mg via INTRAVENOUS
  Filled 2011-04-15 (×2): qty 1

## 2011-04-15 MED ORDER — ACETAMINOPHEN 650 MG RE SUPP: 650.0000 mg | RECTAL | Status: DC | PRN

## 2011-04-15 MED ORDER — SURGIFOAM 100 EX MISC
CUTANEOUS | Status: DC | PRN
  Administered 2011-04-15: 15:00:00 via TOPICAL

## 2011-04-15 MED ORDER — THROMBIN 20000 UNITS EX KIT: PACK | CUTANEOUS | Status: DC | PRN

## 2011-04-15 MED ORDER — OXYCODONE-ACETAMINOPHEN 5-325 MG PO TABS
1.0000 | ORAL_TABLET | ORAL | Status: DC | PRN
  Administered 2011-04-16 (×2): 1 via ORAL
  Administered 2011-04-16: 2 via ORAL
  Filled 2011-04-15: qty 1
  Filled 2011-04-15 (×2): qty 2

## 2011-04-15 MED ORDER — SODIUM CHLORIDE 0.9 % IJ SOLN
3.0000 mL | Freq: Two times a day (BID) | INTRAMUSCULAR | Status: DC
  Administered 2011-04-15: 3 mL via INTRAVENOUS

## 2011-04-15 MED ORDER — HYDROXYZINE HCL 50 MG PO TABS
50.0000 mg | ORAL_TABLET | ORAL | Status: DC | PRN
  Filled 2011-04-15: qty 1

## 2011-04-15 MED ORDER — DEXAMETHASONE SODIUM PHOSPHATE 4 MG/ML IJ SOLN
INTRAMUSCULAR | Status: DC | PRN
  Administered 2011-04-15: 10 mg via INTRAVENOUS

## 2011-04-15 MED ORDER — SODIUM CHLORIDE 0.9 % IJ SOLN: 3.0000 mL | INTRAMUSCULAR | Status: DC | PRN

## 2011-04-15 MED ORDER — DIAZEPAM 5 MG PO TABS
5.0000 mg | ORAL_TABLET | Freq: Four times a day (QID) | ORAL | Status: DC | PRN
  Administered 2011-04-15 – 2011-04-16 (×3): 5 mg via ORAL
  Filled 2011-04-15 (×3): qty 1

## 2011-04-15 MED ORDER — LACTATED RINGERS IV SOLN
INTRAVENOUS | Status: DC
  Administered 2011-04-15 (×3): via INTRAVENOUS

## 2011-04-15 MED ORDER — ACETAMINOPHEN 325 MG PO TABS: 650.0000 mg | ORAL_TABLET | ORAL | Status: DC | PRN

## 2011-04-15 MED ORDER — CEFAZOLIN SODIUM 1-5 GM-% IV SOLN
1.0000 g | Freq: Three times a day (TID) | INTRAVENOUS | Status: AC
  Administered 2011-04-15 – 2011-04-16 (×2): 1 g via INTRAVENOUS
  Filled 2011-04-15 (×2): qty 50

## 2011-04-15 MED ORDER — HYDROMORPHONE HCL PF 1 MG/ML IJ SOLN
0.2500 mg | INTRAMUSCULAR | Status: DC | PRN
  Administered 2011-04-15: 0.5 mg via INTRAVENOUS

## 2011-04-15 MED ORDER — SODIUM CHLORIDE 0.9 % IV SOLN
250.0000 mL | INTRAVENOUS | Status: DC
  Administered 2011-04-15: 250 mL via INTRAVENOUS

## 2011-04-15 MED ORDER — ONDANSETRON HCL 4 MG/2ML IJ SOLN: 4.0000 mg | Freq: Once | INTRAMUSCULAR | Status: DC | PRN

## 2011-04-15 MED ORDER — BUPIVACAINE-EPINEPHRINE 0.25% -1:200000 IJ SOLN
INTRAMUSCULAR | Status: DC | PRN
  Administered 2011-04-15: 2 mL

## 2011-04-15 MED ORDER — CEFAZOLIN SODIUM-DEXTROSE 2-3 GM-% IV SOLR
2.0000 g | INTRAVENOUS | Status: AC
  Administered 2011-04-15: 2 g via INTRAVENOUS

## 2011-04-15 MED ORDER — PROPOFOL 10 MG/ML IV BOLUS
INTRAVENOUS | Status: DC | PRN
  Administered 2011-04-15: 200 mg via INTRAVENOUS

## 2011-04-15 MED ORDER — POTASSIUM CHLORIDE IN NACL 20-0.9 MEQ/L-% IV SOLN
INTRAVENOUS | Status: DC
  Filled 2011-04-15 (×3): qty 1000

## 2011-04-15 MED ORDER — MIDAZOLAM HCL 5 MG/5ML IJ SOLN
INTRAMUSCULAR | Status: DC | PRN
  Administered 2011-04-15: 2 mg via INTRAVENOUS

## 2011-04-15 MED ORDER — FENTANYL CITRATE 0.05 MG/ML IJ SOLN
INTRAMUSCULAR | Status: DC | PRN
  Administered 2011-04-15: 50 ug via INTRAVENOUS
  Administered 2011-04-15 (×2): 100 ug via INTRAVENOUS
  Administered 2011-04-15: 150 ug via INTRAVENOUS
  Administered 2011-04-15 (×5): 50 ug via INTRAVENOUS

## 2011-04-15 MED ORDER — HYDROXYZINE HCL 50 MG/ML IM SOLN
50.0000 mg | INTRAMUSCULAR | Status: DC | PRN
  Filled 2011-04-15: qty 1

## 2011-04-15 MED ORDER — ZOLPIDEM TARTRATE 5 MG PO TABS: 5.0000 mg | ORAL_TABLET | Freq: Every evening | ORAL | Status: DC | PRN

## 2011-04-15 MED ORDER — ALBUTEROL SULFATE HFA 108 (90 BASE) MCG/ACT IN AERS
2.0000 | INHALATION_SPRAY | Freq: Four times a day (QID) | RESPIRATORY_TRACT | Status: DC | PRN
  Filled 2011-04-15: qty 6.7

## 2011-04-15 MED ORDER — PHENOL 1.4 % MT LIQD
1.0000 | OROMUCOSAL | Status: DC | PRN
  Filled 2011-04-15 (×2): qty 177

## 2011-04-15 MED ORDER — POVIDONE-IODINE 7.5 % EX SOLN: Freq: Once | CUTANEOUS | Status: DC

## 2011-04-15 MED ORDER — MENTHOL 3 MG MT LOZG: 1.0000 | LOZENGE | OROMUCOSAL | Status: DC | PRN

## 2011-04-15 MED ORDER — ONDANSETRON HCL 4 MG/2ML IJ SOLN
4.0000 mg | Freq: Four times a day (QID) | INTRAMUSCULAR | Status: DC | PRN
  Administered 2011-04-15: 4 mg via INTRAVENOUS

## 2011-04-15 MED ORDER — ONDANSETRON HCL 4 MG/2ML IJ SOLN
INTRAMUSCULAR | Status: DC | PRN
  Administered 2011-04-15: 4 mg via INTRAVENOUS

## 2011-04-15 MED ORDER — CEFAZOLIN SODIUM-DEXTROSE 2-3 GM-% IV SOLR
INTRAVENOUS | Status: AC
  Filled 2011-04-15: qty 50

## 2011-04-15 MED ORDER — VECURONIUM BROMIDE 10 MG IV SOLR
INTRAVENOUS | Status: DC | PRN
  Administered 2011-04-15: 10 mg via INTRAVENOUS

## 2011-04-15 SURGICAL SUPPLY — 76 items
APL SKNCLS STERI-STRIP NONHPOA (GAUZE/BANDAGES/DRESSINGS) ×2
BENZOIN TINCTURE PRP APPL 2/3 (GAUZE/BANDAGES/DRESSINGS) ×3 IMPLANT
BIT DRILL NEURO 2X3.1 SFT TUCH (MISCELLANEOUS) ×1 IMPLANT
BLADE LONG MED 31X9 (MISCELLANEOUS) IMPLANT
BLADE SURG 15 STRL LF DISP TIS (BLADE) ×1 IMPLANT
BLADE SURG 15 STRL SS (BLADE)
BLADE SURG ROTATE 9660 (MISCELLANEOUS) ×1 IMPLANT
BUR NEURO DRILL SOFT 3.0X3.8M (BURR) ×1 IMPLANT
CARTRIDGE OIL MAESTRO DRILL (MISCELLANEOUS) ×1 IMPLANT
CLOTH BEACON ORANGE TIMEOUT ST (SAFETY) ×3 IMPLANT
COLLAR CERV LO CONTOUR FIRM DE (SOFTGOODS) IMPLANT
CORDS BIPOLAR (ELECTRODE) ×3 IMPLANT
COVER SURGICAL LIGHT HANDLE (MISCELLANEOUS) ×3 IMPLANT
CRADLE DONUT ADULT HEAD (MISCELLANEOUS) ×3 IMPLANT
DEVICE ENDSKLTN MED 6 7MM (Orthopedic Implant) ×2 IMPLANT
DIFFUSER DRILL AIR PNEUMATIC (MISCELLANEOUS) ×1 IMPLANT
DRAIN JACKSON RD 7FR 3/32 (WOUND CARE) ×1 IMPLANT
DRAPE C-ARM 42X72 X-RAY (DRAPES) ×3 IMPLANT
DRAPE POUCH INSTRU U-SHP 10X18 (DRAPES) ×3 IMPLANT
DRAPE SURG 17X23 STRL (DRAPES) ×9 IMPLANT
DRILL NEURO 2X3.1 SOFT TOUCH (MISCELLANEOUS)
DURAPREP 6ML APPLICATOR 50/CS (WOUND CARE) ×3 IMPLANT
ELECT COATED BLADE 2.86 ST (ELECTRODE) ×3 IMPLANT
ELECT REM PT RETURN 9FT ADLT (ELECTROSURGICAL) ×3
ELECTRODE REM PT RTRN 9FT ADLT (ELECTROSURGICAL) ×2 IMPLANT
ENDOSKELETON MED 6 7MM (Orthopedic Implant) ×6 IMPLANT
EVACUATOR SILICONE 100CC (DRAIN) ×1 IMPLANT
GAUZE SPONGE 4X4 16PLY XRAY LF (GAUZE/BANDAGES/DRESSINGS) ×3 IMPLANT
GLOVE BIO SURGEON STRL SZ8 (GLOVE) ×5 IMPLANT
GLOVE BIOGEL PI IND STRL 8 (GLOVE) ×3 IMPLANT
GLOVE BIOGEL PI INDICATOR 8 (GLOVE) ×2
GLOVE BIOGEL PI ORTHO PRO SZ7 (GLOVE) ×1
GLOVE PI ORTHO PRO STRL SZ7 (GLOVE) ×1 IMPLANT
GLOVE SURG SS PI 7.0 STRL IVOR (GLOVE) ×2 IMPLANT
GLOVE SURG SS PI 7.5 STRL IVOR (GLOVE) ×4 IMPLANT
GOWN PREVENTION PLUS XLARGE (GOWN DISPOSABLE) ×3 IMPLANT
GOWN SRG XL XLNG 56XLVL 4 (GOWN DISPOSABLE) ×2 IMPLANT
GOWN STRL NON-REIN LRG LVL3 (GOWN DISPOSABLE) ×1 IMPLANT
GOWN STRL NON-REIN XL XLG LVL4 (GOWN DISPOSABLE) ×6
IV CATH 14GX2 1/4 (CATHETERS) ×3 IMPLANT
KIT BASIN OR (CUSTOM PROCEDURE TRAY) ×3 IMPLANT
KIT ROOM TURNOVER OR (KITS) ×3 IMPLANT
MANIFOLD NEPTUNE II (INSTRUMENTS) ×3 IMPLANT
NDL SPNL 20GX3.5 QUINCKE YW (NEEDLE) ×1 IMPLANT
NEEDLE 27GAX1X1/2 (NEEDLE) ×3 IMPLANT
NEEDLE SPNL 20GX3.5 QUINCKE YW (NEEDLE) ×3 IMPLANT
NS IRRIG 1000ML POUR BTL (IV SOLUTION) ×3 IMPLANT
OIL CARTRIDGE MAESTRO DRILL (MISCELLANEOUS)
PACK ORTHO CERVICAL (CUSTOM PROCEDURE TRAY) ×3 IMPLANT
PAD ARMBOARD 7.5X6 YLW CONV (MISCELLANEOUS) ×6 IMPLANT
PATTIES SURGICAL .5 X.5 (GAUZE/BANDAGES/DRESSINGS) IMPLANT
PATTIES SURGICAL .5 X1 (DISPOSABLE) ×2 IMPLANT
PLATE VECTRA 28MM (Plate) ×2 IMPLANT
PUTTY BONE DBX 5CC MIX (Putty) ×2 IMPLANT
RETAINER SCREW ×4 IMPLANT
SCREW 4.0X16MM (Screw) ×12 IMPLANT
SPONGE GAUZE 4X4 12PLY (GAUZE/BANDAGES/DRESSINGS) ×3 IMPLANT
SPONGE INTESTINAL PEANUT (DISPOSABLE) ×5 IMPLANT
SPONGE SURGIFOAM ABS GEL 100 (HEMOSTASIS) ×2 IMPLANT
STRIP CLOSURE SKIN 1/2X4 (GAUZE/BANDAGES/DRESSINGS) ×3 IMPLANT
SURGIFLO TRUKIT (HEMOSTASIS) IMPLANT
SUT ETHILON 3 0 FSL (SUTURE) IMPLANT
SUT MNCRL AB 4-0 PS2 18 (SUTURE) ×2 IMPLANT
SUT PROLENE 3 0 PS 2 (SUTURE) IMPLANT
SUT SILK 4 0 (SUTURE)
SUT SILK 4-0 18XBRD TIE 12 (SUTURE) IMPLANT
SUT VIC AB 2-0 CT2 18 VCP726D (SUTURE) ×3 IMPLANT
SYR BULB IRRIGATION 50ML (SYRINGE) ×3 IMPLANT
SYR CONTROL 10ML LL (SYRINGE) ×3 IMPLANT
TAPE CLOTH 4X10 WHT NS (GAUZE/BANDAGES/DRESSINGS) ×3 IMPLANT
TAPE UMBILICAL COTTON 1/8X30 (MISCELLANEOUS) ×4 IMPLANT
TOWEL OR 17X24 6PK STRL BLUE (TOWEL DISPOSABLE) ×5 IMPLANT
TOWEL OR 17X26 10 PK STRL BLUE (TOWEL DISPOSABLE) ×3 IMPLANT
TRAY FOLEY CATH 14FR (SET/KITS/TRAYS/PACK) ×1 IMPLANT
WATER STERILE IRR 1000ML POUR (IV SOLUTION) ×3 IMPLANT
YANKAUER SUCT BULB TIP NO VENT (SUCTIONS) ×3 IMPLANT

## 2011-04-15 NOTE — Preoperative (Signed)
Beta Blockers   Reason not to administer Beta Blockers:Not Applicable 

## 2011-04-15 NOTE — H&P (Signed)
PREOPERATIVE H&P  Chief Complaint: Right arm pain  HPI: Emma Walker is a 31 y.o. female who presents with right cervical radiculopathy  Past Medical History  Diagnosis Date  . Asthma   . GERD (gastroesophageal reflux disease)   . Arm pain, right    Past Surgical History  Procedure Date  . Tonsillectomy 1998  . Foot surgery 2002  . Colposcopy 2002   History   Social History  . Marital Status: Married    Spouse Name: N/A    Number of Children: N/A  . Years of Education: N/A   Social History Main Topics  . Smoking status: Current Everyday Smoker -- 0.5 packs/day for 10 years    Types: Cigarettes  . Smokeless tobacco: None  . Alcohol Use: 0.5 oz/week    1 drink(s) per week  . Drug Use: No  . Sexually Active: Yes    Birth Control/ Protection: IUD   Other Topics Concern  . None   Social History Narrative  . None   Family History  Problem Relation Age of Onset  . Asthma Mother   . Bronchiolitis Sister   . Asthma Brother    No Known Allergies Prior to Admission medications   Medication Sig Start Date End Date Taking? Authorizing Provider  albuterol (PROVENTIL HFA;VENTOLIN HFA) 108 (90 BASE) MCG/ACT inhaler Inhale 2 puffs into the lungs every 6 (six) hours as needed. For wheezing   Yes Historical Provider, MD  levonorgestrel (MIRENA) 20 MCG/24HR IUD 1 each by Intrauterine route once.     Historical Provider, MD     All other systems have been reviewed and were otherwise negative with the exception of those mentioned in the HPI and as above.  Physical Exam: Filed Vitals:   04/15/11 1003  BP: 117/80  Pulse: 90  Temp: 98.3 F (36.8 C)  Resp: 20    General: Alert, no acute distress Cardiovascular: No pedal edema Respiratory: No cyanosis, no use of accessory musculature GI: No organomegaly, abdomen is soft and non-tender Skin: No lesions in the area of chief complaint Neurologic: Sensation intact distally Psychiatric: Patient is competent for  consent with normal mood and affect Lymphatic: No axillary or cervical lymphadenopathy  MUSCULOSKELETAL: + spurling's on right  Assessment/Plan: Neck pain with right arm pain Plan for Procedure(s): ANTERIOR CERVICAL DECOMPRESSION C4-6   Emilee Hero, MD 04/15/2011 1:21 PM

## 2011-04-15 NOTE — Anesthesia Preprocedure Evaluation (Signed)
Anesthesia Evaluation  Patient identified by MRN, date of birth, ID band Patient awake    Reviewed: Allergy & Precautions, H&P , NPO status , Patient's Chart, lab work & pertinent test results  Airway Mallampati: I TM Distance: >3 FB Neck ROM: full    Dental  (+) Teeth Intact   Pulmonary asthma ,    Pulmonary exam normal       Cardiovascular regular Normal    Neuro/Psych    GI/Hepatic GERD-  ,  Endo/Other    Renal/GU      Musculoskeletal   Abdominal   Peds  Hematology   Anesthesia Other Findings   Reproductive/Obstetrics                           Anesthesia Physical Anesthesia Plan  ASA: II  Anesthesia Plan: General   Post-op Pain Management:    Induction: Intravenous  Airway Management Planned: Oral ETT  Additional Equipment:   Intra-op Plan:   Post-operative Plan: Extubation in OR  Informed Consent: I have reviewed the patients History and Physical, chart, labs and discussed the procedure including the risks, benefits and alternatives for the proposed anesthesia with the patient or authorized representative who has indicated his/her understanding and acceptance.     Plan Discussed with: CRNA, Anesthesiologist and Surgeon  Anesthesia Plan Comments:         Anesthesia Quick Evaluation

## 2011-04-15 NOTE — Anesthesia Postprocedure Evaluation (Signed)
  Anesthesia Post-op Note  Patient: Emma Walker  Procedure(s) Performed: Procedure(s) (LRB): ANTERIOR CERVICAL DECOMPRESSION/DISCECTOMY FUSION 2 LEVELS (N/A)  Patient Location: PACU  Anesthesia Type: General  Level of Consciousness: awake, oriented, sedated and patient cooperative  Airway and Oxygen Therapy: Patient Spontanous Breathing and Patient connected to nasal cannula oxygen  Post-op Pain: mild  Post-op Assessment: Post-op Vital signs reviewed, Patient's Cardiovascular Status Stable, Respiratory Function Stable, Patent Airway, No signs of Nausea or vomiting and Pain level controlled  Post-op Vital Signs: stable  Complications: No apparent anesthesia complications

## 2011-04-15 NOTE — Transfer of Care (Signed)
Immediate Anesthesia Transfer of Care Note  Patient: Emma Walker  Procedure(s) Performed: Procedure(s) (LRB): ANTERIOR CERVICAL DECOMPRESSION/DISCECTOMY FUSION 2 LEVELS (N/A)  Patient Location: PACU  Anesthesia Type: General  Level of Consciousness: sedated  Airway & Oxygen Therapy: Patient Spontanous Breathing and Patient connected to nasal cannula oxygen  Post-op Assessment: Report given to PACU RN and Post -op Vital signs reviewed and stable  Post vital signs: Reviewed and stable  Complications: No apparent anesthesia complications

## 2011-04-16 NOTE — Progress Notes (Signed)
Pt. Requested Z-Pack for sinus drainage.Dr Yevette Edwards called and pt to see PCP for script. [facility] came to see pt. And requests triad be consulted for this. MD denies need. Pt able to see PCP on D/C.Pt informed. Also, MD to call pain med scripts into Pt. Pharmacy.the patient agrees.

## 2011-04-16 NOTE — Progress Notes (Signed)
Patient doing well, minimal complaints.  Minimal neck pain.  Resolved arm pain.  AVSS NVI Collar in place NVI  POD #1 after C4-6 ACDF - d/c home today - maintain collar - f/u 2 weeks - phlly collar to bedside for showering

## 2011-04-16 NOTE — Discharge Summary (Signed)
NAME:  Emma Walker, Emma Walker NO.:  [id number]  MEDICAL RECORD NO.:  [id number]  LOCATION:  5036                         FACILITY:  [facility]  PHYSICIAN:  Estill Bamberg, MD      DATE OF BIRTH:  12-Sep-1980  DATE OF ADMISSION:  04/15/2011 DATE OF DISCHARGE:  04/16/2011                              DISCHARGE SUMMARY   ADMISSION DIAGNOSIS:  Right-sided cervical radiculopathy.  DISCHARGE DIAGNOSIS:  Right-sided cervical radiculopathy.  ADMISSION HISTORY:  Briefly, [name] is a pleasant 31 year old female who presented to me with severe pain in the right arm.  She did have conservative care, but continued to have pain.  I did review an MRI which was notable for right-sided foraminal stenosis at C4-5 and C5-6. The patient was therefore admitted on April 15, 2011, for an anterior cervical decompression and fusion at the C4-5 and C5-6 levels.  HOSPITAL COURSE:  On April 15, 2011, the patient was brought to surgery and underwent the procedure noted above.  She tolerated the procedure well and was transferred to recovery in stable condition.  The patient was evaluated by me on postoperative day #1.  The patient was neurovascularly intact and the right arm pain was entirely resolved. She had minimal discomfort in her neck and she was uneventfully discharged home.  DISCHARGE INSTRUCTIONS:  The patient will wear her Aspen cervical collar at all times.  She was given a Philadelphia collar to be used for showering.  She will avoid lifting over 10 pounds.  She will take Percocet for pain and Valium for spasms and follow up with me in approximately 2 weeks after her procedure.     Estill Bamberg, MD     MD/MEDQ  D:  04/16/2011  T:  04/16/2011  Job:  [id number]

## 2011-04-16 NOTE — Progress Notes (Signed)
Orthopedic Tech Progress Note Patient Details:  Emma Walker November 15, 1980 [id number]  Other Ortho Devices Type of Ortho Device: Philadelphia cervical collar Ortho Device Interventions: Application   Gaye Pollack 04/16/2011, 9:17 AM

## 2011-04-16 NOTE — Op Note (Signed)
NAME:  Emma Walker, Emma Walker NO.:  [id number]  MEDICAL RECORD NO.:  [id number]  LOCATION:  5036                         FACILITY:  [facility]  PHYSICIAN:  Estill Bamberg, MD      DATE OF BIRTH:  [birth year]  DATE OF PROCEDURE: DATE OF DISCHARGE:                              OPERATIVE REPORT   PREOPERATIVE DIAGNOSIS:  Right-sided cervical radiculopathy.  POSTOPERATIVE DIAGNOSIS:  Right-sided cervical radiculopathy.  PROCEDURE: 1. C4-5, C5-6 anterior cervical decompression and fusion. 2. Placement of anterior instrumentation, C4, C5, C6. 3. Placement of interbody device x2 (tightened 7-mm lordotic medium     interbody cage). 4. Use of local autograft. 5. Use of morselized allograft. 6. Intraoperative use of fluoroscopy.  SURGEON:  Estill Bamberg, MD  ASSISTANT:  None.  ANESTHESIA:  General endotracheal anesthesia.  COMPLICATIONS:  None.  DISPOSITION:  Stable.  ESTIMATED BLOOD LOSS:  Minimal.  INDICATIONS FOR PROCEDURE:  Briefly, Emma Walker is a very pleasant 31 year old female, who initially presented to me with severe pain in the right arm.  The patient did have an epidural injection and other nonoperative care.  The epidural injection did temporarily take away the patient's pain but she did continue to have ongoing pain in the right arm.  Given her failure of conservative care, we did have a discussion regarding operative intervention.  Of note, an MRI did clearly show right-sided foraminal stenosis at the C4-5 and C5-6 levels.  We therefore had a discussion regarding going forward with the C4-5 and C5-6 ACDF.  The patient fully understood the risks and limitations of the procedure as outlined in my preoperative note.  OPERATIVE DETAILS:  On April 15, 2011, the patient brought to surgery and general endotracheal anesthesia was administered.  The patient was placed supine on a well-padded hospital bed.  A bump was placed under the patient's shoulder blades.   The arms were strapped to the patient's sides.  The ulnar nerves were protected.  Antibiotics were given and SCDs were placed.  I then brought in a lateral fluoroscopic view to identify the C4-5 and C5-6 levels.  This did help me optimize the location of the patient's transverse incision.  The neck was then prepped and draped in the usual sterile fashion.  I then made a transverse incision from the midline to the medial border of the sternocleidomastoid muscle.  A plane between the sternocleidomastoid muscle laterally and the strap muscles medially was readily identified and explored and the anterior cervical spine was readily noted.  I then subperiosteally exposed the vertebral bodies of C4, C5, and C6.  A lateral fluoroscopic view did confirm the appropriate operative levels. I then placed a Shadow-Line retractor centered over the C5-6 interspace. I did perform an anterior annulotomy using a knife and I did go forward with a diskectomy using a series of curettes and Kerrison punches.  I then encountered the posterior longitudinal ligament and this was taken down using a nerve hook.  I then used a #1 and then a #2 Kerrison to remove the posterior longitudinal ligament.  The right neural foramen was then encountered and a thorough neuroforaminal decompression was performed.  I then prepared the endplates using a rasp.  I  then placed a series of trials and I did feel the 7-mm interbody trial would be the most appropriate fit.  I then selected a 7-mm lordotic Titan medium implant.  The implant was mixed with DBX and autograft obtained for removing osteophytes anteriorly.  The trial was then tamped into position in the usual fashion.  I then turned my attention towards the C4-5 interspace.  A diskectomy was performed in the manner described previously and a decompression was performed in the manner described previously.  I then prepared the endplates as previously described. Then a 7-mm  implant was again tamped into position using allografts and autografts within the implant.  I then chose an appropriately-sized Synthes Vectra plate.  This was placed over the anterior cervical spine. A total of 6 self-drilling, self-tapping variable angle screws were placed.  I did note an excellent press fit.  I then copiously irrigated the wound.  I was very happy with the construct on both AP and lateral radiographs.  I then closed the platysma using 2-0 Vicryl.  The skin was then closed using 4-0 Monocryl.  Benzoin and Steri-Strips were then applied followed by an Aspen cervical collar.  The patient was then transferred to recovery in stable condition.  All instrument counts were correct at the termination of the procedure.     Estill Bamberg, MD     MD/MEDQ  D:  04/15/2011  T:  04/16/2011  Job:  [id number]  cc:   [facility]

## 2011-04-19 ENCOUNTER — Encounter (HOSPITAL_COMMUNITY): Payer: Self-pay | Admitting: Orthopedic Surgery

## 2013-06-28 DIAGNOSIS — G43011 Migraine without aura, intractable, with status migrainosus: Secondary | ICD-10-CM | POA: Insufficient documentation

## 2013-07-26 DIAGNOSIS — M5412 Radiculopathy, cervical region: Secondary | ICD-10-CM | POA: Diagnosis present

## 2013-09-20 ENCOUNTER — Other Ambulatory Visit: Payer: Self-pay | Admitting: Orthopedic Surgery

## 2013-09-24 ENCOUNTER — Encounter (HOSPITAL_COMMUNITY): Payer: Self-pay | Admitting: Pharmacy Technician

## 2013-09-27 ENCOUNTER — Ambulatory Visit (HOSPITAL_COMMUNITY)
Admission: RE | Admit: 2013-09-27 | Discharge: 2013-09-27 | Disposition: A | Discharge status: Home / Self Care | Payer: 59 | Source: Ambulatory Visit | Attending: Orthopedic Surgery | Admitting: Orthopedic Surgery

## 2013-09-27 ENCOUNTER — Encounter (HOSPITAL_COMMUNITY): Payer: Self-pay

## 2013-09-27 ENCOUNTER — Encounter (HOSPITAL_COMMUNITY)
Admission: RE | Admit: 2013-09-27 | Discharge: 2013-09-27 | Disposition: A | Discharge status: Home / Self Care | Payer: 59 | Source: Ambulatory Visit | Attending: Orthopedic Surgery | Admitting: Orthopedic Surgery

## 2013-09-27 DIAGNOSIS — Z01818 Encounter for other preprocedural examination: Secondary | ICD-10-CM | POA: Insufficient documentation

## 2013-09-27 DIAGNOSIS — J45909 Unspecified asthma, uncomplicated: Secondary | ICD-10-CM | POA: Diagnosis not present

## 2013-09-27 HISTORY — DX: Headache: R51

## 2013-09-27 LAB — CBC WITH DIFFERENTIAL/PLATELET
BASOS ABS: 0.1 10*3/uL (ref 0.0–0.1)
BASOS PCT: 1 % (ref 0–1)
Eosinophils Absolute: 0.3 10*3/uL (ref 0.0–0.7)
Eosinophils Relative: 5 % (ref 0–5)
HEMATOCRIT: 43.1 % (ref 36.0–46.0)
Hemoglobin: 14.4 g/dL (ref 12.0–15.0)
LYMPHS PCT: 39 % (ref 12–46)
Lymphs Abs: 2.6 10*3/uL (ref 0.7–4.0)
MCH: 32.5 pg (ref 26.0–34.0)
MCHC: 33.4 g/dL (ref 30.0–36.0)
MCV: 97.3 fL (ref 78.0–100.0)
MONO ABS: 0.6 10*3/uL (ref 0.1–1.0)
Monocytes Relative: 9 % (ref 3–12)
NEUTROS ABS: 3.2 10*3/uL (ref 1.7–7.7)
Neutrophils Relative %: 46 % (ref 43–77)
PLATELETS: 354 10*3/uL (ref 150–400)
RBC: 4.43 MIL/uL (ref 3.87–5.11)
RDW: 13 % (ref 11.5–15.5)
WBC: 6.7 10*3/uL (ref 4.0–10.5)

## 2013-09-27 LAB — COMPREHENSIVE METABOLIC PANEL
ALBUMIN: 4.1 g/dL (ref 3.5–5.2)
ALT: 11 U/L (ref 0–35)
AST: 17 U/L (ref 0–37)
Alkaline Phosphatase: 81 U/L (ref 39–117)
Anion gap: 13 (ref 5–15)
BUN: 8 mg/dL (ref 6–23)
CALCIUM: 9.6 mg/dL (ref 8.4–10.5)
CO2: 24 mEq/L (ref 19–32)
Chloride: 107 mEq/L (ref 96–112)
Creatinine, Ser: 0.73 mg/dL (ref 0.50–1.10)
GFR calc Af Amer: >90 mL/min (ref >90)
GFR calc non Af Amer: >90 mL/min (ref >90)
Glucose, Bld: 60 mg/dL — ABNORMAL LOW (ref 70–99)
Potassium: 4.2 mEq/L (ref 3.7–5.3)
SODIUM: 144 meq/L (ref 137–147)
Total Bilirubin: 0.3 mg/dL (ref 0.3–1.2)
Total Protein: 7.4 g/dL (ref 6.0–8.3)

## 2013-09-27 LAB — URINALYSIS, ROUTINE W REFLEX MICROSCOPIC
Glucose, UA: NEGATIVE mg/dL
Hgb urine dipstick: NEGATIVE
Ketones, ur: NEGATIVE mg/dL
Leukocytes, UA: NEGATIVE
Nitrite: NEGATIVE
PH: 7 (ref 5.0–8.0)
Protein, ur: NEGATIVE mg/dL
Specific Gravity, Urine: 1.028 (ref 1.005–1.030)
UROBILINOGEN UA: 2 mg/dL — AB (ref 0.0–1.0)

## 2013-09-27 LAB — PROTIME-INR
INR: 1.2 (ref 0.00–1.49)
PROTHROMBIN TIME: 15.2 s (ref 11.6–15.2)

## 2013-09-27 LAB — HCG, SERUM, QUALITATIVE: Preg, Serum: NEGATIVE

## 2013-09-27 LAB — SURGICAL PCR SCREEN
MRSA, PCR: NEGATIVE
STAPHYLOCOCCUS AUREUS: NEGATIVE

## 2013-09-27 LAB — TYPE AND SCREEN
ABO/RH(D): O POS
ANTIBODY SCREEN: NEGATIVE

## 2013-09-27 LAB — APTT: aPTT: 38 seconds — ABNORMAL HIGH (ref 24–37)

## 2013-09-28 NOTE — Progress Notes (Signed)
Anesthesia Chart Review:  Patient is a 33 year old female scheduled for C3-4 ACDF on 10/04/13 by Dr. Yevette Edwardsumonski.  History includes smoking, asthma, GERD, headaches, tonsillectomy, C4-6 ACDF '13.  EKG on 09/27/13 showed NSR.  Preoperative CXR and labs noted.  Glucose was 60.  She is currently posted as a first case.  Pre-operative testing appears acceptable for OR. Further evaluation by her assigned anesthesiologist on the day of surgery.  Velna Ochsllison Fable Huisman, PA-C [facility] Phone [phone] 09/28/2013 1:28 PM

## 2013-10-03 MED ORDER — POVIDONE-IODINE 7.5 % EX SOLN
Freq: Once | CUTANEOUS | Status: DC
  Filled 2013-10-03: qty 118

## 2013-10-03 MED ORDER — CEFAZOLIN SODIUM-DEXTROSE 2-3 GM-% IV SOLR
2.0000 g | INTRAVENOUS | Status: AC
  Administered 2013-10-04: 2 g via INTRAVENOUS
  Filled 2013-10-03: qty 50

## 2013-10-04 ENCOUNTER — Encounter (HOSPITAL_COMMUNITY)
Admission: RE | Disposition: A | Discharge status: Home / Self Care | Payer: Self-pay | Source: Ambulatory Visit | Attending: Orthopedic Surgery

## 2013-10-04 ENCOUNTER — Observation Stay (HOSPITAL_COMMUNITY)
Admission: RE | Admit: 2013-10-04 | Discharge: 2013-10-05 | Disposition: A | Discharge status: Home / Self Care | Payer: 59 | Source: Ambulatory Visit | Attending: Orthopedic Surgery | Admitting: Orthopedic Surgery

## 2013-10-04 ENCOUNTER — Ambulatory Visit (HOSPITAL_COMMUNITY): Payer: 59 | Admitting: Certified Registered Nurse Anesthetist

## 2013-10-04 ENCOUNTER — Encounter (HOSPITAL_COMMUNITY): Payer: 59 | Admitting: Vascular Surgery

## 2013-10-04 ENCOUNTER — Observation Stay (HOSPITAL_COMMUNITY): Payer: 59

## 2013-10-04 ENCOUNTER — Encounter (HOSPITAL_COMMUNITY): Payer: Self-pay | Admitting: *Deleted

## 2013-10-04 DIAGNOSIS — F17200 Nicotine dependence, unspecified, uncomplicated: Secondary | ICD-10-CM | POA: Diagnosis not present

## 2013-10-04 DIAGNOSIS — Z79899 Other long term (current) drug therapy: Secondary | ICD-10-CM | POA: Diagnosis not present

## 2013-10-04 DIAGNOSIS — K219 Gastro-esophageal reflux disease without esophagitis: Secondary | ICD-10-CM | POA: Diagnosis not present

## 2013-10-04 DIAGNOSIS — J45909 Unspecified asthma, uncomplicated: Secondary | ICD-10-CM | POA: Insufficient documentation

## 2013-10-04 DIAGNOSIS — M5412 Radiculopathy, cervical region: Secondary | ICD-10-CM | POA: Diagnosis present

## 2013-10-04 DIAGNOSIS — M4802 Spinal stenosis, cervical region: Principal | ICD-10-CM | POA: Insufficient documentation

## 2013-10-04 DIAGNOSIS — Z981 Arthrodesis status: Secondary | ICD-10-CM | POA: Diagnosis not present

## 2013-10-04 DIAGNOSIS — M5410 Radiculopathy, site unspecified: Secondary | ICD-10-CM

## 2013-10-04 HISTORY — DX: Radiculopathy, site unspecified: M54.10

## 2013-10-04 HISTORY — PX: ANTERIOR CERVICAL DECOMP/DISCECTOMY FUSION: SHX1161

## 2013-10-04 SURGERY — ANTERIOR CERVICAL DECOMPRESSION/DISCECTOMY FUSION 1 LEVEL
Anesthesia: General | Site: Neck

## 2013-10-04 MED ORDER — HYDROMORPHONE HCL PF 1 MG/ML IJ SOLN
INTRAMUSCULAR | Status: AC
  Filled 2013-10-04: qty 1

## 2013-10-04 MED ORDER — FENTANYL CITRATE 0.05 MG/ML IJ SOLN
INTRAMUSCULAR | Status: AC
  Filled 2013-10-04: qty 5

## 2013-10-04 MED ORDER — SENNOSIDES-DOCUSATE SODIUM 8.6-50 MG PO TABS
1.0000 | ORAL_TABLET | Freq: Every evening | ORAL | Status: DC | PRN
  Filled 2013-10-04: qty 1

## 2013-10-04 MED ORDER — PHENYLEPHRINE HCL 10 MG/ML IJ SOLN
INTRAMUSCULAR | Status: DC | PRN
  Administered 2013-10-04 (×3): 40 ug via INTRAVENOUS

## 2013-10-04 MED ORDER — OXYCODONE HCL 5 MG PO TABS: 5.0000 mg | ORAL_TABLET | Freq: Once | ORAL | Status: DC | PRN

## 2013-10-04 MED ORDER — ACETAMINOPHEN 160 MG/5ML PO SOLN: 325.0000 mg | ORAL | Status: DC | PRN

## 2013-10-04 MED ORDER — PROPOFOL 10 MG/ML IV BOLUS
INTRAVENOUS | Status: AC
  Filled 2013-10-04: qty 20

## 2013-10-04 MED ORDER — SENNA 8.6 MG PO TABS
1.0000 | ORAL_TABLET | Freq: Two times a day (BID) | ORAL | Status: DC
  Administered 2013-10-04 – 2013-10-05 (×2): 8.6 mg via ORAL
  Filled 2013-10-04 (×3): qty 1

## 2013-10-04 MED ORDER — KETOROLAC TROMETHAMINE 30 MG/ML IJ SOLN
INTRAMUSCULAR | Status: AC
  Filled 2013-10-04: qty 1

## 2013-10-04 MED ORDER — THROMBIN 20000 UNITS EX SOLR
CUTANEOUS | Status: DC | PRN
Start: 2013-10-04 — End: 2013-10-04
  Administered 2013-10-04: 09:00:00 via TOPICAL

## 2013-10-04 MED ORDER — KETOROLAC TROMETHAMINE 30 MG/ML IJ SOLN: 15.0000 mg | Freq: Once | INTRAMUSCULAR | Status: DC | PRN

## 2013-10-04 MED ORDER — MIDAZOLAM HCL 5 MG/5ML IJ SOLN
INTRAMUSCULAR | Status: DC | PRN
  Administered 2013-10-04: 2 mg via INTRAVENOUS

## 2013-10-04 MED ORDER — BUPIVACAINE-EPINEPHRINE 0.25% -1:200000 IJ SOLN
INTRAMUSCULAR | Status: DC | PRN
  Administered 2013-10-04: 3 mL

## 2013-10-04 MED ORDER — ONDANSETRON HCL 4 MG/2ML IJ SOLN: 4.0000 mg | INTRAMUSCULAR | Status: DC | PRN

## 2013-10-04 MED ORDER — LACTATED RINGERS IV SOLN
INTRAVENOUS | Status: DC | PRN
  Administered 2013-10-04 (×3): via INTRAVENOUS

## 2013-10-04 MED ORDER — CEFAZOLIN SODIUM 1-5 GM-% IV SOLN
1.0000 g | Freq: Three times a day (TID) | INTRAVENOUS | Status: AC
  Administered 2013-10-04 (×2): 1 g via INTRAVENOUS
  Filled 2013-10-04 (×2): qty 50

## 2013-10-04 MED ORDER — SODIUM CHLORIDE 0.9 % IJ SOLN: 3.0000 mL | INTRAMUSCULAR | Status: DC | PRN

## 2013-10-04 MED ORDER — PHENOL 1.4 % MT LIQD: 1.0000 | OROMUCOSAL | Status: DC | PRN

## 2013-10-04 MED ORDER — NORTRIPTYLINE HCL 25 MG PO CAPS
50.0000 mg | ORAL_CAPSULE | Freq: Every evening | ORAL | Status: DC | PRN
  Filled 2013-10-04: qty 2

## 2013-10-04 MED ORDER — FLEET ENEMA 7-19 GM/118ML RE ENEM
1.0000 | ENEMA | Freq: Once | RECTAL | Status: AC | PRN
Start: 2013-10-04 — End: 2013-10-04
  Filled 2013-10-04: qty 1

## 2013-10-04 MED ORDER — ACETAMINOPHEN 325 MG PO TABS: 650.0000 mg | ORAL_TABLET | ORAL | Status: DC | PRN

## 2013-10-04 MED ORDER — MIDAZOLAM HCL 2 MG/2ML IJ SOLN
INTRAMUSCULAR | Status: AC
  Filled 2013-10-04: qty 2

## 2013-10-04 MED ORDER — NEOSTIGMINE METHYLSULFATE 10 MG/10ML IV SOLN
INTRAVENOUS | Status: DC | PRN
  Administered 2013-10-04: 4 mg via INTRAVENOUS

## 2013-10-04 MED ORDER — ALUM & MAG HYDROXIDE-SIMETH 200-200-20 MG/5ML PO SUSP: 30.0000 mL | Freq: Four times a day (QID) | ORAL | Status: DC | PRN

## 2013-10-04 MED ORDER — OXYCODONE-ACETAMINOPHEN 5-325 MG PO TABS
1.0000 | ORAL_TABLET | ORAL | Status: DC | PRN
  Administered 2013-10-04 – 2013-10-05 (×3): 1 via ORAL
  Filled 2013-10-04 (×3): qty 1

## 2013-10-04 MED ORDER — DOCUSATE SODIUM 100 MG PO CAPS
100.0000 mg | ORAL_CAPSULE | Freq: Two times a day (BID) | ORAL | Status: DC
  Administered 2013-10-04 – 2013-10-05 (×2): 100 mg via ORAL
  Filled 2013-10-04 (×4): qty 1

## 2013-10-04 MED ORDER — 0.9 % SODIUM CHLORIDE (POUR BTL) OPTIME
TOPICAL | Status: DC | PRN
  Administered 2013-10-04: 1000 mL

## 2013-10-04 MED ORDER — ACETAMINOPHEN 650 MG RE SUPP: 650.0000 mg | RECTAL | Status: DC | PRN

## 2013-10-04 MED ORDER — MORPHINE SULFATE 2 MG/ML IJ SOLN: 2.0000 mg | INTRAMUSCULAR | Status: DC | PRN

## 2013-10-04 MED ORDER — PROPOFOL 10 MG/ML IV BOLUS
INTRAVENOUS | Status: DC | PRN
  Administered 2013-10-04 (×4): 40 mg via INTRAVENOUS
  Administered 2013-10-04: 80 mg via INTRAVENOUS
  Administered 2013-10-04: 40 mg via INTRAVENOUS
  Administered 2013-10-04: 140 mg via INTRAVENOUS

## 2013-10-04 MED ORDER — GLYCOPYRROLATE 0.2 MG/ML IJ SOLN
INTRAMUSCULAR | Status: DC | PRN
  Administered 2013-10-04: 0.6 mg via INTRAVENOUS

## 2013-10-04 MED ORDER — LABETALOL HCL 5 MG/ML IV SOLN
INTRAVENOUS | Status: DC | PRN
  Administered 2013-10-04: 10 mg via INTRAVENOUS

## 2013-10-04 MED ORDER — THROMBIN 20000 UNITS EX SOLR
CUTANEOUS | Status: AC
  Filled 2013-10-04: qty 20000

## 2013-10-04 MED ORDER — FENTANYL CITRATE 0.05 MG/ML IJ SOLN
INTRAMUSCULAR | Status: DC | PRN
Start: 2013-10-04 — End: 2013-10-04
  Administered 2013-10-04: 100 ug via INTRAVENOUS
  Administered 2013-10-04 (×2): 50 ug via INTRAVENOUS
  Administered 2013-10-04 (×2): 150 ug via INTRAVENOUS

## 2013-10-04 MED ORDER — ONDANSETRON HCL 4 MG/2ML IJ SOLN
INTRAMUSCULAR | Status: DC | PRN
  Administered 2013-10-04: 4 mg via INTRAVENOUS

## 2013-10-04 MED ORDER — ALBUTEROL SULFATE (2.5 MG/3ML) 0.083% IN NEBU: 3.0000 mL | INHALATION_SOLUTION | Freq: Four times a day (QID) | RESPIRATORY_TRACT | Status: DC | PRN

## 2013-10-04 MED ORDER — BUPIVACAINE-EPINEPHRINE (PF) 0.25% -1:200000 IJ SOLN
INTRAMUSCULAR | Status: AC
  Filled 2013-10-04: qty 30

## 2013-10-04 MED ORDER — ROCURONIUM BROMIDE 100 MG/10ML IV SOLN
INTRAVENOUS | Status: DC | PRN
Start: 2013-10-04 — End: 2013-10-04
  Administered 2013-10-04: 50 mg via INTRAVENOUS

## 2013-10-04 MED ORDER — SODIUM CHLORIDE 0.9 % IJ SOLN
3.0000 mL | Freq: Two times a day (BID) | INTRAMUSCULAR | Status: DC
  Administered 2013-10-04 (×2): 3 mL via INTRAVENOUS

## 2013-10-04 MED ORDER — POTASSIUM CHLORIDE IN NACL 20-0.9 MEQ/L-% IV SOLN
INTRAVENOUS | Status: DC
  Filled 2013-10-04 (×4): qty 1000

## 2013-10-04 MED ORDER — LIDOCAINE HCL (CARDIAC) 20 MG/ML IV SOLN
INTRAVENOUS | Status: DC | PRN
  Administered 2013-10-04: 60 mg via INTRAVENOUS

## 2013-10-04 MED ORDER — HYDROMORPHONE HCL PF 1 MG/ML IJ SOLN: 0.2500 mg | INTRAMUSCULAR | Status: DC | PRN

## 2013-10-04 MED ORDER — ZOLPIDEM TARTRATE 5 MG PO TABS: 5.0000 mg | ORAL_TABLET | Freq: Every evening | ORAL | Status: DC | PRN

## 2013-10-04 MED ORDER — MENTHOL 3 MG MT LOZG
1.0000 | LOZENGE | OROMUCOSAL | Status: DC | PRN
  Administered 2013-10-04: 3 mg via ORAL
  Filled 2013-10-04: qty 9

## 2013-10-04 MED ORDER — OXYCODONE HCL 5 MG/5ML PO SOLN: 5.0000 mg | Freq: Once | ORAL | Status: DC | PRN

## 2013-10-04 MED ORDER — DEXAMETHASONE SODIUM PHOSPHATE 10 MG/ML IJ SOLN
INTRAMUSCULAR | Status: DC | PRN
  Administered 2013-10-04: 10 mg via INTRAVENOUS

## 2013-10-04 MED ORDER — DIAZEPAM 5 MG PO TABS
5.0000 mg | ORAL_TABLET | Freq: Four times a day (QID) | ORAL | Status: DC | PRN
  Administered 2013-10-04 – 2013-10-05 (×3): 5 mg via ORAL
  Filled 2013-10-04 (×3): qty 1

## 2013-10-04 MED ORDER — BISACODYL 5 MG PO TBEC
5.0000 mg | DELAYED_RELEASE_TABLET | Freq: Every day | ORAL | Status: DC | PRN
  Filled 2013-10-04: qty 1

## 2013-10-04 MED ORDER — ACETAMINOPHEN 325 MG PO TABS: 325.0000 mg | ORAL_TABLET | ORAL | Status: DC | PRN

## 2013-10-04 SURGICAL SUPPLY — 79 items
APL SKNCLS STERI-STRIP NONHPOA (GAUZE/BANDAGES/DRESSINGS) ×1
BENZOIN TINCTURE PRP APPL 2/3 (GAUZE/BANDAGES/DRESSINGS) ×3 IMPLANT
BIT DRILL 14X3 FLUT 2XNS (BIT) IMPLANT
BIT DRILL NEURO 2X3.1 SFT TUCH (MISCELLANEOUS) ×1 IMPLANT
BIT DRL 14X3 FLUT 2XNS (BIT) ×2
BLADE SURG 15 STRL LF DISP TIS (BLADE) ×1 IMPLANT
BLADE SURG 15 STRL SS (BLADE) ×3
BLADE SURG ROTATE 9660 (MISCELLANEOUS) ×3 IMPLANT
BUR MATCHSTICK NEURO 3.0 LAGG (BURR) IMPLANT
CARTRIDGE OIL MAESTRO DRILL (MISCELLANEOUS) ×1 IMPLANT
CLOSURE STERI-STRIP 1/2X4 (GAUZE/BANDAGES/DRESSINGS) ×1
CLOSURE WOUND 1/2 X4 (GAUZE/BANDAGES/DRESSINGS) ×1
CLSR STERI-STRIP ANTIMIC 1/2X4 (GAUZE/BANDAGES/DRESSINGS) ×1 IMPLANT
COLLAR CERV LO CONTOUR FIRM DE (SOFTGOODS) ×2 IMPLANT
CORDS BIPOLAR (ELECTRODE) ×3 IMPLANT
COVER SURGICAL LIGHT HANDLE (MISCELLANEOUS) ×3 IMPLANT
CRADLE DONUT ADULT HEAD (MISCELLANEOUS) ×3 IMPLANT
DIFFUSER DRILL AIR PNEUMATIC (MISCELLANEOUS) ×3 IMPLANT
DRAIN JACKSON RD 7FR 3/32 (WOUND CARE) IMPLANT
DRAPE C-ARM 42X72 X-RAY (DRAPES) ×3 IMPLANT
DRAPE POUCH INSTRU U-SHP 10X18 (DRAPES) ×3 IMPLANT
DRAPE SURG 17X23 STRL (DRAPES) ×9 IMPLANT
DRILL BIT 14MM (BIT) ×6
DRILL NEURO 2X3.1 SOFT TOUCH (MISCELLANEOUS) ×3
DURAPREP 26ML APPLICATOR (WOUND CARE) ×3 IMPLANT
ELECT COATED BLADE 2.86 ST (ELECTRODE) ×3 IMPLANT
ELECT REM PT RETURN 9FT ADLT (ELECTROSURGICAL) ×3
ELECTRODE REM PT RTRN 9FT ADLT (ELECTROSURGICAL) ×1 IMPLANT
EVACUATOR SILICONE 100CC (DRAIN) IMPLANT
GAUZE SPONGE 4X4 12PLY STRL (GAUZE/BANDAGES/DRESSINGS) ×3 IMPLANT
GAUZE SPONGE 4X4 16PLY XRAY LF (GAUZE/BANDAGES/DRESSINGS) ×5 IMPLANT
GLOVE BIO SURGEON STRL SZ7 (GLOVE) ×3 IMPLANT
GLOVE BIO SURGEON STRL SZ8 (GLOVE) ×3 IMPLANT
GLOVE BIOGEL PI IND STRL 7.0 (GLOVE) ×2 IMPLANT
GLOVE BIOGEL PI IND STRL 8 (GLOVE) ×1 IMPLANT
GLOVE BIOGEL PI INDICATOR 7.0 (GLOVE) ×4
GLOVE BIOGEL PI INDICATOR 8 (GLOVE) ×2
GOWN STRL REUS W/ TWL LRG LVL3 (GOWN DISPOSABLE) ×1 IMPLANT
GOWN STRL REUS W/ TWL XL LVL3 (GOWN DISPOSABLE) ×1 IMPLANT
GOWN STRL REUS W/TWL LRG LVL3 (GOWN DISPOSABLE) ×3
GOWN STRL REUS W/TWL XL LVL3 (GOWN DISPOSABLE) ×3
IV CATH 14GX2 1/4 (CATHETERS) ×3 IMPLANT
KIT BASIN OR (CUSTOM PROCEDURE TRAY) ×3 IMPLANT
KIT ROOM TURNOVER OR (KITS) ×3 IMPLANT
MANIFOLD NEPTUNE II (INSTRUMENTS) ×1 IMPLANT
NDL SPNL 20GX3.5 QUINCKE YW (NEEDLE) ×1 IMPLANT
NEEDLE 27GAX1X1/2 (NEEDLE) ×3 IMPLANT
NEEDLE SPNL 20GX3.5 QUINCKE YW (NEEDLE) ×3 IMPLANT
NS IRRIG 1000ML POUR BTL (IV SOLUTION) ×3 IMPLANT
OIL CARTRIDGE MAESTRO DRILL (MISCELLANEOUS) ×3
PACK ORTHO CERVICAL (CUSTOM PROCEDURE TRAY) ×3 IMPLANT
PAD ARMBOARD 7.5X6 YLW CONV (MISCELLANEOUS) ×6 IMPLANT
PATTIES SURGICAL .5 X.5 (GAUZE/BANDAGES/DRESSINGS) IMPLANT
PATTIES SURGICAL .5 X1 (DISPOSABLE) IMPLANT
PEEK IMPLANT 7MM STERILE (Peek) ×2 IMPLANT
PIN DISTRACTION 14 (PIN) ×4 IMPLANT
PUTTY BONE DBX 2.5 MIS (Bone Implant) ×2 IMPLANT
SCREW SELF DRILLING 14MM (Screw) ×4 IMPLANT
SCREW SELF DRILLING 16MM (Screw) ×2 IMPLANT
SPONGE GAUZE 4X4 12PLY STER LF (GAUZE/BANDAGES/DRESSINGS) ×2 IMPLANT
SPONGE INTESTINAL PEANUT (DISPOSABLE) ×5 IMPLANT
SPONGE SURGIFOAM ABS GEL 100 (HEMOSTASIS) IMPLANT
STRIP CLOSURE SKIN 1/2X4 (GAUZE/BANDAGES/DRESSINGS) ×2 IMPLANT
SURGIFLO TRUKIT (HEMOSTASIS) IMPLANT
SUT BONE WAX W31G (SUTURE) ×2 IMPLANT
SUT MNCRL AB 4-0 PS2 18 (SUTURE) IMPLANT
SUT PROLENE 5 0 PC 1 (SUTURE) ×4 IMPLANT
SUT SILK 4 0 (SUTURE)
SUT SILK 4-0 18XBRD TIE 12 (SUTURE) IMPLANT
SUT VIC AB 2-0 CT2 18 VCP726D (SUTURE) ×3 IMPLANT
SYR BULB IRRIGATION 50ML (SYRINGE) ×3 IMPLANT
SYR CONTROL 10ML LL (SYRINGE) ×6 IMPLANT
TAPE CLOTH 4X10 WHT NS (GAUZE/BANDAGES/DRESSINGS) ×3 IMPLANT
TAPE CLOTH SURG 4X10 WHT LF (GAUZE/BANDAGES/DRESSINGS) ×2 IMPLANT
TAPE UMBILICAL COTTON 1/8X30 (MISCELLANEOUS) ×3 IMPLANT
TOWEL OR 17X24 6PK STRL BLUE (TOWEL DISPOSABLE) ×3 IMPLANT
TOWEL OR 17X26 10 PK STRL BLUE (TOWEL DISPOSABLE) ×3 IMPLANT
WATER STERILE IRR 1000ML POUR (IV SOLUTION) ×3 IMPLANT
YANKAUER SUCT BULB TIP NO VENT (SUCTIONS) ×3 IMPLANT

## 2013-10-04 NOTE — H&P (Signed)
     PREOPERATIVE H&P  Chief Complaint: Right shoulder pain  HPI: Emma Walker is a 33 y.o. female who presents with ongoing pain in the right shoulder  MRI reveals stenosis at C3/4  Patient has failed multiple forms of conservative care and continues to have pain (see office notes for additional details regarding the patient's full course of treatment)  Past Medical History  Diagnosis Date  . Asthma   . GERD (gastroesophageal reflux disease)   . Arm pain, right   . Headache(784.0)     migraines   Past Surgical History  Procedure Laterality Date  . Tonsillectomy  1998  . Foot surgery  2002  . Colposcopy  2002  . Anterior cervical decomp/discectomy fusion  04/15/2011    Procedure: ANTERIOR CERVICAL DECOMPRESSION/DISCECTOMY FUSION 2 LEVELS;  Surgeon: Emilee HeroMark Leonard Shannie Kontos, MD;  [facility];  Service: Orthopedics;  Laterality: N/A;  Anterior cervical decompression fusion, cervical 4-5, cervical 5-6 with instrumentation and allograft.   History   Social History  . Marital Status: Married    Spouse Name: N/A    Number of Children: N/A  . Years of Education: N/A   Social History Main Topics  . Smoking status: Current Every Day Smoker -- 0.50 packs/day for 10 years    Types: Cigarettes  . Smokeless tobacco: None  . Alcohol Use: 0.5 oz/week    1 drink(s) per week  . Drug Use: No  . Sexual Activity: Yes    Birth Control/ Protection: IUD   Other Topics Concern  . None   Social History Narrative  . None   Family History  Problem Relation Age of Onset  . Asthma Mother   . Bronchiolitis Sister   . Asthma Brother    No Known Allergies Prior to Admission medications   Medication Sig Start Date End Date Taking? Authorizing Provider  cyclobenzaprine (FLEXERIL) 5 MG tablet Take 5 mg by mouth at bedtime as needed for muscle spasms.   Yes Historical Provider, MD  levonorgestrel (MIRENA) 20 MCG/24HR IUD 1 each by Intrauterine route once.    Yes Historical  Provider, MD  nortriptyline (PAMELOR) 25 MG capsule Take 50 mg by mouth at bedtime as needed for sleep.   Yes Historical Provider, MD  albuterol (PROVENTIL HFA;VENTOLIN HFA) 108 (90 BASE) MCG/ACT inhaler Inhale 2 puffs into the lungs every 6 (six) hours as needed. For wheezing    Historical Provider, MD     All other systems have been reviewed and were otherwise negative with the exception of those mentioned in the HPI and as above.  Physical Exam: Filed Vitals:   10/04/13 0618  BP:   Pulse:   Temp:   Resp: 16    General: Alert, no acute distress Cardiovascular: No pedal edema Respiratory: No cyanosis, no use of accessory musculature Skin: No lesions in the area of chief complaint Neurologic: Sensation intact distally Psychiatric: Patient is competent for consent with normal mood and affect Lymphatic: No axillary or cervical lymphadenopathy   Assessment/Plan: Neck and right arm pain Plan for Procedure(s): ANTERIOR CERVICAL DECOMPRESSION/DISCECTOMY FUSION 1 LEVEL   Emilee HeroUMONSKI,Quitman Norberto LEONARD, MD 10/04/2013 7:08 AM

## 2013-10-04 NOTE — Transfer of Care (Signed)
Immediate Anesthesia Transfer of Care Note  Patient: Archie Endorkeesha M Cabriales  Procedure(s) Performed: Procedure(s) with comments: ANTERIOR CERVICAL DECOMPRESSION/DISCECTOMY FUSION 1 LEVEL (N/A) - Anterior cervical decompression fusion cervical 3-4 with instrumentation and allograft  Patient Location: PACU  Anesthesia Type:General  Level of Consciousness: sedated, patient cooperative and responds to stimulation  Airway & Oxygen Therapy: Patient Spontanous Breathing and Patient connected to nasal cannula oxygen  Post-op Assessment: Report given to PACU RN, Post -op Vital signs reviewed and stable and Patient moving all extremities X 4  Post vital signs: Reviewed and stable  Complications: No apparent anesthesia complications

## 2013-10-04 NOTE — Anesthesia Postprocedure Evaluation (Signed)
  Anesthesia Post-op Note  Patient: Emma Walker  Procedure(s) Performed: Procedure(s) with comments: ANTERIOR CERVICAL DECOMPRESSION/DISCECTOMY FUSION 1 LEVEL (N/A) - Anterior cervical decompression fusion cervical 3-4 with instrumentation and allograft  Patient Location: PACU  Anesthesia Type:General  Level of Consciousness: awake and alert   Airway and Oxygen Therapy: Patient Spontanous Breathing and Patient connected to nasal cannula oxygen  Post-op Pain: mild  Post-op Assessment: Post-op Vital signs reviewed, Patient's Cardiovascular Status Stable, Respiratory Function Stable, Patent Airway, No signs of Nausea or vomiting and Pain level controlled  Post-op Vital Signs: Reviewed and stable  Last Vitals:  Filed Vitals:   10/04/13 1305  BP: 124/81  Pulse: 78  Temp: 37 C  Resp: 18    Complications: delayed emergance

## 2013-10-04 NOTE — Plan of Care (Signed)
Problem: Consults Goal: Diagnosis - Spinal Surgery Outcome: Completed/Met Date Met:  10/04/13 Cervical Spine Fusion

## 2013-10-04 NOTE — Op Note (Signed)
NAME:  Emma Walker, Emma Walker NO.:  [id number]  MEDICAL RECORD NO.:  [id number]  LOCATION:  3C07C                        FACILITY:  [facility]  PHYSICIAN:  Estill Bamberg, MD      DATE OF BIRTH:  27-Jun-1980  DATE OF PROCEDURE:  10/04/2013                              OPERATIVE REPORT   PREOPERATIVE DIAGNOSES: 1. Adjacent segment disease, C3-4, status post a previous C4-C6     anterior cervical discectomy and fusion. 2. Right C4 radiculopathy. 3. 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  POSTOPERATIVE DIAGNOSES: 1. Adjacent segment disease, C3-4, status post a previous C4-C6     anterior cervical discectomy and fusion. 2. Right C4 radiculopathy. 3. 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  PROCEDURES: 1. Anterior cervical decompression and fusion, C3-4. 2. Placement of anterior instrumentation C3, C4. 3. Insertion of interbody device x1 (Zero-P standard interbody spacer,     7 mm in height). 4. Use of morselized allograft-demineralized bone matrix mix. 5. Intraoperative use of fluoroscopy.  SURGEON:  Estill Bamberg, MD  ASSISTANT:  Jason Coop, PA-C  ANESTHESIA:  General endotracheal anesthesia.  COMPLICATIONS:  None.  DISPOSITION:  Stable.  ESTIMATED BLOOD LOSS:  Minimal.  INDICATIONS FOR SURGERY:  Briefly, Emma Walker is a very pleasant 33- year-old female, who is years status post a C4-C6 ACDF.  The patient did extremely well from that surgery, and did more recently go on to have right shoulder pain consistent with right C4 radiculopathy.  An MRI did reveal severe stenosis on the right side at C3-4.  A right C4 selective nerve block did entirely alleviate the patient's right shoulder pain for temporarily.  Given the patient's ongoing pain and lack of improvement with conservative care, we did discuss proceeding with the procedure reflected above.  The patient did elect to proceed.  OPERATIVE DETAILS:  On October 04, 2013, the patient was brought to surgery  and general endotracheal anesthesia was administered.  The patient was placed supine on the hospital bed.  The neck was gently extended.  The neck was prepped and draped in the usual fashion.  A left transverse incision was made superior to the patient's previous incision.  There was scar tissue identified, however, I was able to develop a plane between the sternocleidomastoid muscle and the strap muscles.  The carotid artery was palpated and retracted laterally.  The esophagus was retracted medially.  The anterior spine was noted.  I then subperiosteally exposed the vertebral bodies of C3 and C4.  A self- retaining retractor was placed and Caspar pins were placed into the C4 and C3 vertebral bodies.  Distraction was applied.  I then went forward with a standard diskectomy.  The posterior longitudinal ligament was noted and entered using a nerve hook.  I then used a #1 followed by a #2 Kerrison to perform a thorough and complete right-sided neural foraminal decompression.  I was very pleased with the decompression.  The endplates were then prepared and I then filled a 7 mm interbody spacer of the appropriate height with DBX mix, which was tamped into position in the usual fashion.  I did use fluoroscopy while placing the implant, and was very pleased with the radiographic appearance.  The anterior plate  was then secured to the implant.  I then placed a vertebral body screws, 1 in each vertebral body of C3 and C4.  I did note excellent purchase of the screws.  I was very pleased with the AP and lateral fluoroscopic images.  The screws were noted to be secured to the plate with the locking mechanism.  The wound was then copiously irrigated. Distraction was then discontinued and the Caspar pins were removed and bone wax was placed into place.  I then explored the wound for any worrisome bleeding.  There are minor areas of bleeding which was controlled using bipolar electrocautery.  There was  minor bleeding noted from the superficial vein as well.  The vein was easily and uneventfully tied off using 5-0 Prolene.  This did control the bleeding completely. I then closed the platysma using 2-0 Vicryl.  The skin was then closed using 3-0 Monocryl.  Benzoin and Steri-Strips were applied followed by a sterile dressing.  All instrument counts were correct at the termination of the procedure.  Jason CoopKayla McKenzie was my assistant throughout surgery, and did aid in retraction, suctioning, and closure.     Estill BambergMark Azlyn Wingler, MD     MD/MEDQ  D:  10/04/2013  T:  10/04/2013  Job:  [id number]

## 2013-10-04 NOTE — Anesthesia Preprocedure Evaluation (Signed)
Anesthesia Evaluation  Patient identified by MRN, date of birth, ID band Patient awake    Reviewed: Allergy & Precautions, H&P , NPO status , Patient's Chart, lab work & pertinent test results  History of Anesthesia Complications Negative for: history of anesthetic complications  Airway Mallampati: I TM Distance: >3 FB Neck ROM: Full    Dental  (+) Teeth Intact   Pulmonary asthma , Current Smoker,  breath sounds clear to auscultation        Cardiovascular negative cardio ROS  Rhythm:Regular     Neuro/Psych  Headaches, Neck pain with right arm symptoms, unchanged by neck movement  Neuromuscular disease negative psych ROS   GI/Hepatic Neg liver ROS, GERD-  Controlled,  Endo/Other  negative endocrine ROS  Renal/GU negative Renal ROS     Musculoskeletal   Abdominal   Peds  Hematology negative hematology ROS (+)   Anesthesia Other Findings   Reproductive/Obstetrics                           Anesthesia Physical Anesthesia Plan  ASA: II  Anesthesia Plan: General   Post-op Pain Management:    Induction: Intravenous  Airway Management Planned: Oral ETT  Additional Equipment: None  Intra-op Plan:   Post-operative Plan: Extubation in OR  Informed Consent: I have reviewed the patients History and Physical, chart, labs and discussed the procedure including the risks, benefits and alternatives for the proposed anesthesia with the patient or authorized representative who has indicated his/her understanding and acceptance.   Dental advisory given  Plan Discussed with: CRNA and Surgeon  Anesthesia Plan Comments:         Anesthesia Quick Evaluation

## 2013-10-04 NOTE — Progress Notes (Signed)
Patient now in PACU.  Patient arousable.  BP 123/74  Pulse 80  Temp(Src) 97.5 F (36.4 C) (Oral)  Resp 16  Ht 5\' 8"  (1.727 m)  Wt 58.514 kg (129 lb)  BMI 19.62 kg/m2  SpO2 100%  LMP 09/25/2013  Moving all extremities appropriately with full strength Neck soft/supple  Plan is to transfer to [facility] when medically appropriate C-collar at all times No lifting > 10 pounds

## 2013-10-05 ENCOUNTER — Encounter (HOSPITAL_COMMUNITY): Payer: Self-pay | Admitting: Orthopedic Surgery

## 2013-10-05 DIAGNOSIS — M4802 Spinal stenosis, cervical region: Secondary | ICD-10-CM | POA: Diagnosis not present

## 2013-10-05 NOTE — Progress Notes (Signed)
Patient alert and oriented, mae's well, voiding adequate amount of urine, swallowing without difficulty, no c/o pain at the time of discharged. Patient discharged home with family. Script and discharged instructions given to patient. Patient and family stated understanding of instructions given.

## 2013-10-05 NOTE — Progress Notes (Signed)
UR Completed Saliou Barnier Graves-Bigelow, RN,BSN [phone]  

## 2013-10-05 NOTE — Progress Notes (Signed)
    Patient doing well, reports neck pain/stiffness, resolved R arm pain, ongoing L shoulder pain with known rotator cuff pathology. Swallowing some improved today, has been eating/drinking. Has been up and ambulating.   Physical Exam: BP 107/61  Pulse 87  Temp(Src) 98.7 F (37.1 C) (Oral)  Resp 18  Ht 5\' 8"  (1.727 m)  Wt 58.514 kg (129 lb)  BMI 19.62 kg/m2  SpO2 97%  LMP 09/25/2013  Soft collar in place, Dressing in place, neck soft and supple. Pt laying comfortably in hospital bed, SCD's in place, NVI  POD #1 s/p C3-4 ACDF for adjacent segment R radiculopathy   - Resolved R arm pain - Expected PO neck pain, well controlled on current medications  - Ambulate hallways - Percocet for pain, Valium for muscle spasms - likely d/c home today after breakfast and ambulation   -D/C orders printed and in chart  -D/C scripts signed and in chart  - Smoking cessation again discussed, pt will utilize Chantix to aid  - Soft collar 2 weeks  - F/U in office 2 weeks

## 2013-10-10 NOTE — Discharge Summary (Signed)
Patient ID: Emma Walker MRN: [id number] DOB/AGE: [birth year] 33 y.o.  Admit date: 10/04/2013 Discharge date: 10/05/2013  Admission Diagnoses:  Active Problems:   Radiculopathy   Discharge Diagnoses:  Same  Past Medical History  Diagnosis Date  . Asthma   . GERD (gastroesophageal reflux disease)   . Arm pain, right   . Headache(784.0)     migraines  . Radiculopathy 10/04/2013    Surgeries: Procedure(s): ANTERIOR CERVICAL DECOMPRESSION/DISCECTOMY FUSION 1 LEVEL C3-4 on 10/04/2013   Consultants:  None  Discharged Condition: Improved  Hospital Course: Emma Walker is an 33 y.o. female who was admitted 10/04/2013 for operative treatment of radiculopathy. Patient has severe unremitting pain that affects sleep, daily activities, and work/hobbies. After pre-op clearance the patient was taken to the operating room on 10/04/2013 and underwent  Procedure(s): ANTERIOR CERVICAL DECOMPRESSION/DISCECTOMY FUSION 1 LEVEL C3-4.    Patient was given perioperative antibiotics:  Anti-infectives   Start     Dose/Rate Route Frequency Ordered Stop   10/04/13 1530  ceFAZolin (ANCEF) IVPB 1 g/50 mL premix     1 g 100 mL/hr over 30 Minutes Intravenous Every 8 hours 10/04/13 1301 10/05/13 0025   10/04/13 0600  ceFAZolin (ANCEF) IVPB 2 g/50 mL premix     2 g 100 mL/hr over 30 Minutes Intravenous On call to O.R. 10/03/13 1427 10/04/13 0730       Patient was given sequential compression devices, early ambulation to prevent DVT.  Patient benefited maximally from hospital stay and there were no complications.    Recent vital signs: BP 123/77  Pulse 94  Temp(Src) 98.3 F (36.8 C) (Oral)  Resp 18  Ht 5\' 8"  (1.727 m)  Wt 58.514 kg (129 lb)  BMI 19.62 kg/m2  SpO2 99%  LMP 09/25/2013  Discharge Medications:     Medication List    STOP taking these medications       cyclobenzaprine 5 MG tablet  Commonly known as:  FLEXERIL      TAKE these medications       albuterol 108  (90 BASE) MCG/ACT inhaler  Commonly known as:  PROVENTIL HFA;VENTOLIN HFA  Inhale 2 puffs into the lungs every 6 (six) hours as needed. For wheezing     levonorgestrel 20 MCG/24HR IUD  Commonly known as:  MIRENA  1 each by Intrauterine route once.     nortriptyline 25 MG capsule  Commonly known as:  PAMELOR  Take 50 mg by mouth at bedtime as needed for sleep.        Diagnostic Studies: Dg Chest 2 View  09/27/2013   CLINICAL DATA:  Preoperative cervical fusion and decompression; history of asthma  EXAM: CHEST  2 VIEW  COMPARISON:  April 12, 2011  FINDINGS: Lungs are clear. Heart size and pulmonary vascularity are normal. No adenopathy. There is postoperative change in the lower cervical spine.  IMPRESSION: No edema or consolidation.   Electronically Signed   By: Bretta Bang M.D.   On: 09/27/2013 12:51   Dg Cervical Spine 1 View  10/04/2013   CLINICAL DATA:  Anterior cervical spine fusion.  EXAM: DG C-ARM 61-120 MIN; DG CERVICAL SPINE - 1 VIEW  FINDINGS: Single spot portable image of the cervical spine demonstrates new fusion screws and hardware at C3-C4. Hardware is well-seated. Previous fusion hardware spanning C4-C6 is well-seated and stable from the prior exam dated 02/31/2013. There are metal intervertebral cages maintaining disc height at C4-C5 and C5-C6. There is no evidence of an operative  complication.  IMPRESSION: C3-C4 anterior cervical spine fusion image. No evidence of an operative complication.   Electronically Signed   By: Amie Portlandavid  Ormond M.D.   On: 10/04/2013 15:46   Dg C-arm 1-60 Min  10/04/2013   CLINICAL DATA:  Anterior cervical spine fusion.  EXAM: DG C-ARM 61-120 MIN; DG CERVICAL SPINE - 1 VIEW  FINDINGS: Single spot portable image of the cervical spine demonstrates new fusion screws and hardware at C3-C4. Hardware is well-seated. Previous fusion hardware spanning C4-C6 is well-seated and stable from the prior exam dated 02/31/2013. There are metal intervertebral cages  maintaining disc height at C4-C5 and C5-C6. There is no evidence of an operative complication.  IMPRESSION: C3-C4 anterior cervical spine fusion image. No evidence of an operative complication.   Electronically Signed   By: Amie Portlandavid  Ormond M.D.   On: 10/04/2013 15:46    Disposition: 01-Home or Self Care  POD #1 s/p C3-4 ACDF for adjacent segment R radiculopathy   - Resolved R arm pain  - Expected PO neck pain, well controlled on current medications  - Ambulate hallways  - Percocet for pain, Valium for muscle spasms  - D/C home today after breakfast and ambulation   -D/C orders printed and in chart   -D/C scripts signed and in chart  - Smoking cessation again discussed, pt will utilize Chantix to aid  - Soft collar 2 weeks  - F/U in office 2 weeks    Signed: Georga BoraMCKENZIE, Royce Sciara J 10/10/2013, 12:43 PM

## 2013-12-24 ENCOUNTER — Encounter (HOSPITAL_COMMUNITY): Payer: Self-pay | Admitting: Orthopedic Surgery

## 2014-02-05 ENCOUNTER — Encounter: Payer: Self-pay | Admitting: *Deleted

## 2014-02-13 ENCOUNTER — Ambulatory Visit (INDEPENDENT_AMBULATORY_CARE_PROVIDER_SITE_OTHER): Payer: 59 | Admitting: Obstetrics and Gynecology

## 2014-02-13 ENCOUNTER — Encounter: Payer: Self-pay | Admitting: Obstetrics and Gynecology

## 2014-02-13 VITALS — BP 115/70 | HR 79 | Temp 98.0°F | Ht 62.0 in | Wt 136.9 lb

## 2014-02-13 DIAGNOSIS — O09211 Supervision of pregnancy with history of pre-term labor, first trimester: Secondary | ICD-10-CM

## 2014-02-13 DIAGNOSIS — O09291 Supervision of pregnancy with other poor reproductive or obstetric history, first trimester: Secondary | ICD-10-CM

## 2014-02-13 DIAGNOSIS — Z3481 Encounter for supervision of other normal pregnancy, first trimester: Secondary | ICD-10-CM

## 2014-02-13 DIAGNOSIS — Z8759 Personal history of other complications of pregnancy, childbirth and the puerperium: Secondary | ICD-10-CM | POA: Insufficient documentation

## 2014-02-13 DIAGNOSIS — Z3480 Encounter for supervision of other normal pregnancy, unspecified trimester: Secondary | ICD-10-CM | POA: Insufficient documentation

## 2014-02-13 DIAGNOSIS — O3680X1 Pregnancy with inconclusive fetal viability, fetus 1: Secondary | ICD-10-CM

## 2014-02-13 LAB — POCT URINALYSIS DIP (DEVICE)
Bilirubin Urine: NEGATIVE
Glucose, UA: NEGATIVE mg/dL
Hgb urine dipstick: NEGATIVE
KETONES UR: NEGATIVE mg/dL
Leukocytes, UA: NEGATIVE
Nitrite: NEGATIVE
PROTEIN: NEGATIVE mg/dL
SPECIFIC GRAVITY, URINE: 1.02 (ref 1.005–1.030)
Urobilinogen, UA: 1 mg/dL (ref 0.0–1.0)
pH: 7 (ref 5.0–8.0)

## 2014-02-13 LAB — US OB COMP LESS 14 WKS

## 2014-02-13 NOTE — Progress Notes (Signed)
Subjective:    Emma Walker is a [id number] 323w4d being seen today for her first obstetrical visit.  Her obstetrical history is significant for Hx third tri SB, autopsy normal. Patient does intend to breast feed. Pregnancy history fully reviewed.  Patient reports no complaints.  There were no vitals filed for this visit.  HISTORY: OB History  Gravida Para Term Preterm AB SAB TAB Ectopic Multiple Living  5 3 2 1 1 1    2     # Outcome Date GA Lbr Len/2nd Weight Sex Delivery Anes PTL Lv  5 Current           4 Term 06/19/10 7334w0d  6 lb 2 oz (2.778 kg) Genella MechM Vag-Spont EPI  Y  3 Term 12/08/07 4777w0d   M Vag-Spont EPI  Y  2 SAB 2004     SAB     1 Preterm 02/21/02 6173w5d   F Vag-Spont None  FD     Comments: System Generated. Please review and update pregnancy details.     Past Medical History  Diagnosis Date  . Asthma   . GERD (gastroesophageal reflux disease)   . Arm pain, right   . Headache(784.0)     migraines  . Radiculopathy 10/04/2013   Past Surgical History  Procedure Laterality Date  . Tonsillectomy  1998  . Foot surgery  2002  . Colposcopy  2002  . Anterior cervical decomp/discectomy fusion  04/15/2011    Procedure: ANTERIOR CERVICAL DECOMPRESSION/DISCECTOMY FUSION 2 LEVELS;  Surgeon: Emilee HeroMark Leonard Dumonski, MD;  [facility];  Service: Orthopedics;  Laterality: N/A;  Anterior cervical decompression fusion, cervical 4-5, cervical 5-6 with instrumentation and allograft.  . Anterior cervical decomp/discectomy fusion N/A 10/04/2013    Procedure: ANTERIOR CERVICAL DECOMPRESSION/DISCECTOMY FUSION 1 LEVEL;  Surgeon: Emilee HeroMark Leonard Dumonski, MD;  [facility];  Service: Orthopedics;  Laterality: N/A;  Anterior cervical decompression fusion cervical 3-4 with instrumentation and allograft   Family History  Problem Relation Age of Onset  . Asthma Mother   . Bronchiolitis Sister   . Asthma Brother      Exam    Uterus:   S=D. Viable per scan by Diane  Pelvic Exam:    Perineum: No Hemorrhoids, Normal Perineum   Vulva: normal   Vagina:  normal discharge       Cervix: multiparous appearance   Adnexa: not evaluated   Bony Pelvis: gynecoid  System: Breast:  normal appearance, no masses or tenderness   Skin: normal coloration and turgor, no rashes    Neurologic: oriented, normal, grossly non-focal, normal mood   Extremities: normal strength, tone, and muscle mass   HEENT PERRLA and thyroid without masses   Mouth/Teeth mucous membranes moist, pharynx normal without lesions and dental hygiene good   Neck supple and no masses   Cardiovascular: regular rate and rhythm, no murmurs or gallops   Respiratory:  appears well, vitals normal, no respiratory distress, acyanotic, normal RR, ear and throat exam is normal, neck free of mass or lymphadenopathy, chest clear, no wheezing, crepitations, rhonchi, normal symmetric air entry   Abdomen: soft, non-tender; bowel sounds normal; no masses,  no organomegaly   Urinary: urethral meatus normal      Assessment:    Pregnancy: [id number] Patient Active Problem List   Diagnosis Date Noted  . Supervision of other normal pregnancy, antepartum 02/13/2014  . History of stillbirth in currently pregnant patient 02/13/2014  . Radiculopathy 10/04/2013        Plan:  Initial labs drawn. Prenatal vitamins. Problem list reviewed and updated. Genetic Screening discussed First Screen: undecided > will read information and decide next visit.  Ultrasound discussed; fetal survey: requested.  Follow up in 4 weeks. 50% of 30 min visit spent on counseling and coordination of care.    Dmari Schubring 02/13/2014

## 2014-02-13 NOTE — Progress Notes (Signed)
New ob packet given Pt would like to information on foods that should be eaten during pregnancy Pt received flu vaccine at PCP

## 2014-02-13 NOTE — Progress Notes (Signed)
Bedside US for viability = single IUP,  FHR = 170 per PW doppler

## 2014-02-14 LAB — PRESCRIPTION MONITORING PROFILE (19 PANEL)
Amphetamine/Meth: NEGATIVE ng/mL
BARBITURATE SCREEN, URINE: NEGATIVE ng/mL
BUPRENORPHINE, URINE: NEGATIVE ng/mL
Benzodiazepine Screen, Urine: NEGATIVE ng/mL
CANNABINOID SCRN UR: NEGATIVE ng/mL
CARISOPRODOL, URINE: NEGATIVE ng/mL
Cocaine Metabolites: NEGATIVE ng/mL
Creatinine, Urine: 173.39 mg/dL (ref >20.0)
Fentanyl, Ur: NEGATIVE ng/mL
MDMA URINE: NEGATIVE ng/mL
METHADONE SCREEN, URINE: NEGATIVE ng/mL
METHAQUALONE SCREEN (URINE): NEGATIVE ng/mL
Meperidine, Ur: NEGATIVE ng/mL
NITRITES URINE, INITIAL: NEGATIVE ug/mL
OXYCODONE SCRN UR: NEGATIVE ng/mL
Opiate Screen, Urine: NEGATIVE ng/mL
PHENCYCLIDINE, UR: NEGATIVE ng/mL
PROPOXYPHENE: NEGATIVE ng/mL
TAPENTADOLUR: NEGATIVE ng/mL
Tramadol Scrn, Ur: NEGATIVE ng/mL
Zolpidem, Urine: NEGATIVE ng/mL
pH, Initial: 7.5 pH (ref 4.5–8.9)

## 2014-02-14 LAB — PRENATAL PROFILE (SOLSTAS)
Antibody Screen: NEGATIVE
Basophils Absolute: 0.1 10*3/uL (ref 0.0–0.1)
Basophils Relative: 1 % (ref 0–1)
Eosinophils Absolute: 0.1 10*3/uL (ref 0.0–0.7)
Eosinophils Relative: 2 % (ref 0–5)
HCT: 39.8 % (ref 36.0–46.0)
HEP B S AG: NEGATIVE
HIV: NONREACTIVE
Hemoglobin: 13.2 g/dL (ref 12.0–15.0)
LYMPHS ABS: 2.2 10*3/uL (ref 0.7–4.0)
LYMPHS PCT: 33 % (ref 12–46)
MCH: 31.8 pg (ref 26.0–34.0)
MCHC: 33.2 g/dL (ref 30.0–36.0)
MCV: 95.9 fL (ref 78.0–100.0)
MONO ABS: 0.7 10*3/uL (ref 0.1–1.0)
MONOS PCT: 10 % (ref 3–12)
MPV: 9.1 fL — ABNORMAL LOW (ref 9.4–12.4)
NEUTROS ABS: 3.7 10*3/uL (ref 1.7–7.7)
NEUTROS PCT: 54 % (ref 43–77)
Platelets: 397 10*3/uL (ref 150–400)
RBC: 4.15 MIL/uL (ref 3.87–5.11)
RDW: 13.4 % (ref 11.5–15.5)
RH TYPE: POSITIVE
RUBELLA: 2.99 {index} — AB (ref <0.90)
WBC: 6.8 10*3/uL (ref 4.0–10.5)

## 2014-02-15 LAB — HEMOGLOBINOPATHY EVALUATION
HGB A2 QUANT: 2.3 % (ref 2.2–3.2)
Hemoglobin Other: 0 %
Hgb A: 96.7 % — ABNORMAL LOW (ref 96.8–97.8)
Hgb F Quant: 1 % (ref 0.0–2.0)
Hgb S Quant: 0 %

## 2014-02-15 LAB — CULTURE, OB URINE
Colony Count: NO GROWTH
Organism ID, Bacteria: NO GROWTH

## 2014-02-19 LAB — CYTOLOGY - PAP

## 2014-02-22 NOTE — L&D Delivery Note (Signed)
Delivery Note At 7:30 AM a viable and healthy female was delivered via Vaginal, Spontaneous Delivery (Presentation: ; Occiput Anterior).  APGAR: 8, 8; weight 7 lb 6.5 oz (3360 g).   Placenta status: Intact, Spontaneous.  Cord: 3 vessels with the following complications: None.  Cord pH: NA  Anesthesia: Epidural  Episiotomy: None Lacerations: None Suture Repair: NA Est. Blood Loss (mL): 227  Mom to postpartum.  Baby to Couplet care / Skin to Skin. Placenta to: BS Feeding: Breast Circ: NA Contraception: Emma Walker, Emma Walker 09/15/2014, 9:28 AM

## 2014-03-21 ENCOUNTER — Other Ambulatory Visit: Payer: Self-pay | Admitting: Family

## 2014-03-21 ENCOUNTER — Ambulatory Visit (INDEPENDENT_AMBULATORY_CARE_PROVIDER_SITE_OTHER): Payer: Medicaid Other | Admitting: Family

## 2014-03-21 ENCOUNTER — Ambulatory Visit (HOSPITAL_COMMUNITY)
Admission: RE | Admit: 2014-03-21 | Discharge: 2014-03-21 | Disposition: A | Discharge status: Home / Self Care | Payer: 59 | Source: Ambulatory Visit | Attending: Family | Admitting: Family

## 2014-03-21 VITALS — BP 129/76 | HR 90 | Temp 98.3°F | Wt 142.3 lb

## 2014-03-21 DIAGNOSIS — O09292 Supervision of pregnancy with other poor reproductive or obstetric history, second trimester: Secondary | ICD-10-CM

## 2014-03-21 DIAGNOSIS — Z36 Encounter for antenatal screening of mother: Secondary | ICD-10-CM | POA: Diagnosis not present

## 2014-03-21 DIAGNOSIS — O0992 Supervision of high risk pregnancy, unspecified, second trimester: Secondary | ICD-10-CM

## 2014-03-21 DIAGNOSIS — Z3A13 13 weeks gestation of pregnancy: Secondary | ICD-10-CM | POA: Insufficient documentation

## 2014-03-21 DIAGNOSIS — N898 Other specified noninflammatory disorders of vagina: Secondary | ICD-10-CM

## 2014-03-21 DIAGNOSIS — L298 Other pruritus: Secondary | ICD-10-CM

## 2014-03-21 DIAGNOSIS — Z3682 Encounter for antenatal screening for nuchal translucency: Secondary | ICD-10-CM

## 2014-03-21 DIAGNOSIS — O09291 Supervision of pregnancy with other poor reproductive or obstetric history, first trimester: Secondary | ICD-10-CM | POA: Insufficient documentation

## 2014-03-21 LAB — POCT URINALYSIS DIP (DEVICE)
Bilirubin Urine: NEGATIVE
GLUCOSE, UA: NEGATIVE mg/dL
HGB URINE DIPSTICK: NEGATIVE
Ketones, ur: NEGATIVE mg/dL
Nitrite: NEGATIVE
PH: 7 (ref 5.0–8.0)
Protein, ur: NEGATIVE mg/dL
Specific Gravity, Urine: 1.02 (ref 1.005–1.030)
Urobilinogen, UA: 1 mg/dL (ref 0.0–1.0)

## 2014-03-21 MED ORDER — FLUCONAZOLE 150 MG PO TABS: 150.0000 mg | ORAL_TABLET | Freq: Once | ORAL | Status: DC

## 2014-03-21 MED ORDER — MEDICAL COMPRESSION STOCKINGS MISC: Status: DC

## 2014-03-21 NOTE — Progress Notes (Signed)
Pt desires first screen, able to schedule for today at 4pm.  Reports vaginal itching and itching on breasts.  No lesions.  RX Diflucan.  Wet prep sent.  Also reports spider veins developing > pregnancy compression stockings.

## 2014-03-21 NOTE — Progress Notes (Signed)
Patient reports vaginal itching like a yeast infection; also reports severe breast itching

## 2014-03-21 NOTE — Patient Instructions (Signed)
For compression stockings contact: [facility] [address], [place], [place] [address] [phone]   

## 2014-03-21 NOTE — Progress Notes (Signed)
First Screen 03/21/14 @ 4p with [facility].

## 2014-03-22 LAB — WET PREP, GENITAL
CLUE CELLS WET PREP: NONE SEEN
Trich, Wet Prep: NONE SEEN

## 2014-03-23 ENCOUNTER — Encounter: Payer: Self-pay | Admitting: Family

## 2014-03-26 ENCOUNTER — Encounter: Payer: Self-pay | Admitting: Obstetrics & Gynecology

## 2014-03-26 ENCOUNTER — Ambulatory Visit (INDEPENDENT_AMBULATORY_CARE_PROVIDER_SITE_OTHER): Payer: 59 | Admitting: Obstetrics & Gynecology

## 2014-03-26 VITALS — BP 118/67 | Wt 142.0 lb

## 2014-03-26 DIAGNOSIS — L509 Urticaria, unspecified: Secondary | ICD-10-CM

## 2014-03-26 DIAGNOSIS — O0992 Supervision of high risk pregnancy, unspecified, second trimester: Secondary | ICD-10-CM

## 2014-03-26 MED ORDER — TRIAMCINOLONE ACETONIDE 0.1 % EX CREA: 1.0000 "application " | TOPICAL_CREAM | Freq: Two times a day (BID) | CUTANEOUS | Status: DC

## 2014-03-26 NOTE — Progress Notes (Signed)
Patient still has breast itching and burning .  Worse below nipples onto areola.   Remove all scented products, triamcenalone ointment, cerave lotion to breasts (also having dry feet and hands).   First screen pending. RTC 1 week if not improved.

## 2014-03-28 ENCOUNTER — Encounter: Payer: Self-pay | Admitting: *Deleted

## 2014-04-02 ENCOUNTER — Other Ambulatory Visit (HOSPITAL_COMMUNITY): Payer: Self-pay

## 2014-04-15 ENCOUNTER — Ambulatory Visit (INDEPENDENT_AMBULATORY_CARE_PROVIDER_SITE_OTHER): Payer: 59 | Admitting: Advanced Practice Midwife

## 2014-04-15 VITALS — BP 118/71 | HR 91 | Wt 149.0 lb

## 2014-04-15 DIAGNOSIS — O0992 Supervision of high risk pregnancy, unspecified, second trimester: Secondary | ICD-10-CM

## 2014-04-15 DIAGNOSIS — Z36 Encounter for antenatal screening of mother: Secondary | ICD-10-CM

## 2014-04-15 NOTE — Progress Notes (Signed)
AFP today. Anatomy scan ordered for 2/29. Discussed antenatal testing for Hx IUFD. Plans of traveling early June. Will miss NST. Rec that she not miss it, but encourage testing as soon as possible beforve and after travel, travel precautions and bring records with her.

## 2014-04-15 NOTE — Patient Instructions (Signed)
Second Trimester of Pregnancy The second trimester is from week 13 through week 28, months 4 through 6. The second trimester is often a time when you feel your best. Your body has also adjusted to being pregnant, and you begin to feel better physically. Usually, morning sickness has lessened or quit completely, you may have more energy, and you may have an increase in appetite. The second trimester is also a time when the fetus is growing rapidly. At the end of the sixth month, the fetus is about 9 inches long and weighs about 1 pounds. You will likely begin to feel the baby move (quickening) between 18 and 20 weeks of the pregnancy. BODY CHANGES Your body goes through many changes during pregnancy. The changes vary from woman to woman.   Your weight will continue to increase. You will notice your lower abdomen bulging out.  You may begin to get stretch marks on your hips, abdomen, and breasts.  You may develop headaches that can be relieved by medicines approved by your health care provider.  You may urinate more often because the fetus is pressing on your bladder.  You may develop or continue to have heartburn as a result of your pregnancy.  You may develop constipation because certain hormones are causing the muscles that push waste through your intestines to slow down.  You may develop hemorrhoids or swollen, bulging veins (varicose veins).  You may have back pain because of the weight gain and pregnancy hormones relaxing your joints between the bones in your pelvis and as a result of a shift in weight and the muscles that support your balance.  Your breasts will continue to grow and be tender.  Your gums may bleed and may be sensitive to brushing and flossing.  Dark spots or blotches (chloasma, mask of pregnancy) may develop on your face. This will likely fade after the baby is born.  A dark line from your belly button to the pubic area (linea nigra) may appear. This will likely fade  after the baby is born.  You may have changes in your hair. These can include thickening of your hair, rapid growth, and changes in texture. Some women also have hair loss during or after pregnancy, or hair that feels dry or thin. Your hair will most likely return to normal after your baby is born. WHAT TO EXPECT AT YOUR PRENATAL VISITS During a routine prenatal visit:  You will be weighed to make sure you and the fetus are growing normally.  Your blood pressure will be taken.  Your abdomen will be measured to track your baby's growth.  The fetal heartbeat will be listened to.  Any test results from the previous visit will be discussed. Your health care provider may ask you:  How you are feeling.  If you are feeling the baby move.  If you have had any abnormal symptoms, such as leaking fluid, bleeding, severe headaches, or abdominal cramping.  If you have any questions. Other tests that may be performed during your second trimester include:  Blood tests that check for:  Low iron levels (anemia).  Gestational diabetes (between 24 and 28 weeks).  Rh antibodies.  Urine tests to check for infections, diabetes, or protein in the urine.  An ultrasound to confirm the proper growth and development of the baby.  An amniocentesis to check for possible genetic problems.  Fetal screens for spina bifida and Down syndrome. HOME CARE INSTRUCTIONS   Avoid all smoking, herbs, alcohol, and unprescribed   drugs. These chemicals affect the formation and growth of the baby.  Follow your health care provider's instructions regarding medicine use. There are medicines that are either safe or unsafe to take during pregnancy.  Exercise only as directed by your health care provider. Experiencing uterine cramps is a good sign to stop exercising.  Continue to eat regular, healthy meals.  Wear a good support bra for breast tenderness.  Do not use hot tubs, steam rooms, or saunas.  Wear your  seat belt at all times when driving.  Avoid raw meat, uncooked cheese, cat litter boxes, and soil used by cats. These carry germs that can cause birth defects in the baby.  Take your prenatal vitamins.  Try taking a stool softener (if your health care provider approves) if you develop constipation. Eat more high-fiber foods, such as fresh vegetables or fruit and whole grains. Drink plenty of fluids to keep your urine clear or pale yellow.  Take warm sitz baths to soothe any pain or discomfort caused by hemorrhoids. Use hemorrhoid cream if your health care provider approves.  If you develop varicose veins, wear support hose. Elevate your feet for 15 minutes, 3-4 times a day. Limit salt in your diet.  Avoid heavy lifting, wear low heel shoes, and practice good posture.  Rest with your legs elevated if you have leg cramps or low back pain.  Visit your dentist if you have not gone yet during your pregnancy. Use a soft toothbrush to brush your teeth and be gentle when you floss.  A sexual relationship may be continued unless your health care provider directs you otherwise.  Continue to go to all your prenatal visits as directed by your health care provider. SEEK MEDICAL CARE IF:   You have dizziness.  You have mild pelvic cramps, pelvic pressure, or nagging pain in the abdominal area.  You have persistent nausea, vomiting, or diarrhea.  You have a bad smelling vaginal discharge.  You have pain with urination. SEEK IMMEDIATE MEDICAL CARE IF:   You have a fever.  You are leaking fluid from your vagina.  You have spotting or bleeding from your vagina.  You have severe abdominal cramping or pain.  You have rapid weight gain or loss.  You have shortness of breath with chest pain.  You notice sudden or extreme swelling of your face, hands, ankles, feet, or legs.  You have not felt your baby move in over an hour.  You have severe headaches that do not go away with  medicine.  You have vision changes. Document Released: 02/02/2001 Document Revised: 02/13/2013 Document Reviewed: 04/11/2012 [employer] Patient Information 2015 [employer]. This information is not intended to replace advice given to you by your health care provider. Make sure you discuss any questions you have with your health care provider.  

## 2014-04-16 LAB — ALPHA FETOPROTEIN, MATERNAL
AFP: 39.1 ng/mL
CURR GEST AGE: 17 wks.days
MoM for AFP: 0.89
OPEN SPINA BIFIDA: NEGATIVE
Osb Risk: 1:38800 {titer}

## 2014-04-18 ENCOUNTER — Encounter: Payer: Medicaid Other | Admitting: Obstetrics and Gynecology

## 2014-04-22 ENCOUNTER — Ambulatory Visit (HOSPITAL_COMMUNITY)
Admission: RE | Admit: 2014-04-22 | Discharge: 2014-04-22 | Disposition: A | Discharge status: Home / Self Care | Payer: 59 | Source: Ambulatory Visit | Attending: Obstetrics & Gynecology | Admitting: Obstetrics & Gynecology

## 2014-04-22 DIAGNOSIS — Z3A18 18 weeks gestation of pregnancy: Secondary | ICD-10-CM | POA: Diagnosis not present

## 2014-04-22 DIAGNOSIS — Z36 Encounter for antenatal screening of mother: Secondary | ICD-10-CM | POA: Diagnosis present

## 2014-04-22 DIAGNOSIS — O09292 Supervision of pregnancy with other poor reproductive or obstetric history, second trimester: Secondary | ICD-10-CM | POA: Diagnosis not present

## 2014-04-22 DIAGNOSIS — Z3689 Encounter for other specified antenatal screening: Secondary | ICD-10-CM | POA: Insufficient documentation

## 2014-04-22 DIAGNOSIS — O0992 Supervision of high risk pregnancy, unspecified, second trimester: Secondary | ICD-10-CM

## 2014-05-13 ENCOUNTER — Ambulatory Visit (INDEPENDENT_AMBULATORY_CARE_PROVIDER_SITE_OTHER): Payer: 59 | Admitting: Advanced Practice Midwife

## 2014-05-13 VITALS — BP 110/64 | HR 86 | Wt 154.0 lb

## 2014-05-13 DIAGNOSIS — O09292 Supervision of pregnancy with other poor reproductive or obstetric history, second trimester: Secondary | ICD-10-CM

## 2014-05-13 DIAGNOSIS — M5430 Sciatica, unspecified side: Secondary | ICD-10-CM

## 2014-05-13 DIAGNOSIS — B3731 Acute candidiasis of vulva and vagina: Secondary | ICD-10-CM

## 2014-05-13 DIAGNOSIS — B373 Candidiasis of vulva and vagina: Secondary | ICD-10-CM

## 2014-05-13 MED ORDER — IBUPROFEN 600 MG PO TABS: 600.0000 mg | ORAL_TABLET | Freq: Four times a day (QID) | ORAL | Status: DC | PRN

## 2014-05-13 MED ORDER — TERCONAZOLE 0.4 % VA CREA: 1.0000 | TOPICAL_CREAM | Freq: Every day | VAGINAL | Status: DC

## 2014-05-13 NOTE — Patient Instructions (Signed)
Sciatica with Rehab The sciatic nerve runs from the back down the leg and is responsible for sensation and control of the muscles in the back (posterior) side of the thigh, lower leg, and foot. Sciatica is a condition that is characterized by inflammation of this nerve.  SYMPTOMS   Signs of nerve damage, including numbness and/or weakness along the posterior side of the lower extremity.  Pain in the back of the thigh that may also travel down the leg.  Pain that worsens when sitting for long periods of time.  Occasionally, pain in the back or buttock. CAUSES  Inflammation of the sciatic nerve is the cause of sciatica. The inflammation is due to something irritating the nerve. Common sources of irritation include:  Sitting for long periods of time.  Direct trauma to the nerve.  Arthritis of the spine.  Herniated or ruptured disk.  Slipping of the vertebrae (spondylolisthesis).  Pressure from soft tissues, such as muscles or ligament-like tissue (fascia). RISK INCREASES WITH:  Sports that place pressure or stress on the spine (football or weightlifting).  Poor strength and flexibility.  Failure to warm up properly before activity.  Family history of low back pain or disk disorders.  Previous back injury or surgery.  Poor body mechanics, especially when lifting, or poor posture. PREVENTION   Warm up and stretch properly before activity.  Maintain physical fitness:  Strength, flexibility, and endurance.  Cardiovascular fitness.  Learn and use proper technique, especially with posture and lifting. When possible, have coach correct improper technique.  Avoid activities that place stress on the spine. PROGNOSIS If treated properly, then sciatica usually resolves within 6 weeks. However, occasionally surgery is necessary.  RELATED COMPLICATIONS   Permanent nerve damage, including pain, numbness, tingle, or weakness.  Chronic back pain.  Risks of surgery: infection,  bleeding, nerve damage, or damage to surrounding tissues. TREATMENT Treatment initially involves resting from any activities that aggravate your symptoms. The use of ice and medication may help reduce pain and inflammation. The use of strengthening and stretching exercises may help reduce pain with activity. These exercises may be performed at home or with referral to a therapist. A therapist may recommend further treatments, such as transcutaneous electronic nerve stimulation (TENS) or ultrasound. Your caregiver may recommend corticosteroid injections to help reduce inflammation of the sciatic nerve. If symptoms persist despite non-surgical (conservative) treatment, then surgery may be recommended. MEDICATION  If pain medication is necessary, then nonsteroidal anti-inflammatory medications, such as aspirin and ibuprofen, or other minor pain relievers, such as acetaminophen, are often recommended.  Do not take pain medication for 7 days before surgery.  Prescription pain relievers may be given if deemed necessary by your caregiver. Use only as directed and only as much as you need.  Ointments applied to the skin may be helpful.  Corticosteroid injections may be given by your caregiver. These injections should be reserved for the most serious cases, because they may only be given a certain number of times. HEAT AND COLD  Cold treatment (icing) relieves pain and reduces inflammation. Cold treatment should be applied for 10 to 15 minutes every 2 to 3 hours for inflammation and pain and immediately after any activity that aggravates your symptoms. Use ice packs or massage the area with a piece of ice (ice massage).  Heat treatment may be used prior to performing the stretching and strengthening activities prescribed by your caregiver, physical therapist, or athletic trainer. Use a heat pack or soak the injury in warm water.   SEEK MEDICAL CARE IF:  Treatment seems to offer no benefit, or the condition  worsens.  Any medications produce adverse side effects. EXERCISES  RANGE OF MOTION (ROM) AND STRETCHING EXERCISES - Sciatica Most people with sciatic will find that their symptoms worsen with either excessive bending forward (flexion) or arching at the low back (extension). The exercises which will help resolve your symptoms will focus on the opposite motion. Your physician, physical therapist or athletic trainer will help you determine which exercises will be most helpful to resolve your low back pain. Do not complete any exercises without first consulting with your clinician. Discontinue any exercises which worsen your symptoms until you speak to your clinician. If you have pain, numbness or tingling which travels down into your buttocks, leg or foot, the goal of the therapy is for these symptoms to move closer to your back and eventually resolve. Occasionally, these leg symptoms will get better, but your low back pain may worsen; this is typically an indication of progress in your rehabilitation. Be certain to be very alert to any changes in your symptoms and the activities in which you participated in the 24 hours prior to the change. Sharing this information with your clinician will allow him/her to most efficiently treat your condition. These exercises may help you when beginning to rehabilitate your injury. Your symptoms may resolve with or without further involvement from your physician, physical therapist or athletic trainer. While completing these exercises, remember:   Restoring tissue flexibility helps normal motion to return to the joints. This allows healthier, less painful movement and activity.  An effective stretch should be held for at least 30 seconds.  A stretch should never be painful. You should only feel a gentle lengthening or release in the stretched tissue. FLEXION RANGE OF MOTION AND STRETCHING EXERCISES: STRETCH - Flexion, Single Knee to Chest   Lie on a firm bed or floor  with both legs extended in front of you.  Keeping one leg in contact with the floor, bring your opposite knee to your chest. Hold your leg in place by either grabbing behind your thigh or at your knee.  Pull until you feel a gentle stretch in your low back. Hold __________ seconds.  Slowly release your grasp and repeat the exercise with the opposite side. Repeat __________ times. Complete this exercise __________ times per day.  STRETCH - Flexion, Double Knee to Chest  Lie on a firm bed or floor with both legs extended in front of you.  Keeping one leg in contact with the floor, bring your opposite knee to your chest.  Tense your stomach muscles to support your back and then lift your other knee to your chest. Hold your legs in place by either grabbing behind your thighs or at your knees.  Pull both knees toward your chest until you feel a gentle stretch in your low back. Hold __________ seconds.  Tense your stomach muscles and slowly return one leg at a time to the floor. Repeat __________ times. Complete this exercise __________ times per day.  STRETCH - Low Trunk Rotation   Lie on a firm bed or floor. Keeping your legs in front of you, bend your knees so they are both pointed toward the ceiling and your feet are flat on the floor.  Extend your arms out to the side. This will stabilize your upper body by keeping your shoulders in contact with the floor.  Gently and slowly drop both knees together to one side until   you feel a gentle stretch in your low back. Hold for __________ seconds.  Tense your stomach muscles to support your low back as you bring your knees back to the starting position. Repeat the exercise to the other side. Repeat __________ times. Complete this exercise __________ times per day  EXTENSION RANGE OF MOTION AND FLEXIBILITY EXERCISES: STRETCH - Extension, Prone on Elbows  Lie on your stomach on the floor, a bed will be too soft. Place your palms about shoulder  width apart and at the height of your head.  Place your elbows under your shoulders. If this is too painful, stack pillows under your chest.  Allow your body to relax so that your hips drop lower and make contact more completely with the floor.  Hold this position for __________ seconds.  Slowly return to lying flat on the floor. Repeat __________ times. Complete this exercise __________ times per day.  RANGE OF MOTION - Extension, Prone Press Ups  Lie on your stomach on the floor, a bed will be too soft. Place your palms about shoulder width apart and at the height of your head.  Keeping your back as relaxed as possible, slowly straighten your elbows while keeping your hips on the floor. You may adjust the placement of your hands to maximize your comfort. As you gain motion, your hands will come more underneath your shoulders.  Hold this position __________ seconds.  Slowly return to lying flat on the floor. Repeat __________ times. Complete this exercise __________ times per day.  STRENGTHENING EXERCISES - Sciatica  These exercises may help you when beginning to rehabilitate your injury. These exercises should be done near your "sweet spot." This is the neutral, low-back arch, somewhere between fully rounded and fully arched, that is your least painful position. When performed in this safe range of motion, these exercises can be used for people who have either a flexion or extension based injury. These exercises may resolve your symptoms with or without further involvement from your physician, physical therapist or athletic trainer. While completing these exercises, remember:   Muscles can gain both the endurance and the strength needed for everyday activities through controlled exercises.  Complete these exercises as instructed by your physician, physical therapist or athletic trainer. Progress with the resistance and repetition exercises only as your caregiver advises.  You may  experience muscle soreness or fatigue, but the pain or discomfort you are trying to eliminate should never worsen during these exercises. If this pain does worsen, stop and make certain you are following the directions exactly. If the pain is still present after adjustments, discontinue the exercise until you can discuss the trouble with your clinician. STRENGTHENING - Deep Abdominals, Pelvic Tilt   Lie on a firm bed or floor. Keeping your legs in front of you, bend your knees so they are both pointed toward the ceiling and your feet are flat on the floor.  Tense your lower abdominal muscles to press your low back into the floor. This motion will rotate your pelvis so that your tail bone is scooping upwards rather than pointing at your feet or into the floor.  With a gentle tension and even breathing, hold this position for __________ seconds. Repeat __________ times. Complete this exercise __________ times per day.  STRENGTHENING - Abdominals, Crunches   Lie on a firm bed or floor. Keeping your legs in front of you, bend your knees so they are both pointed toward the ceiling and your feet are flat on the   floor. Cross your arms over your chest.  Slightly tip your chin down without bending your neck.  Tense your abdominals and slowly lift your trunk high enough to just clear your shoulder blades. Lifting higher can put excessive stress on the low back and does not further strengthen your abdominal muscles.  Control your return to the starting position. Repeat __________ times. Complete this exercise __________ times per day.  STRENGTHENING - Quadruped, Opposite UE/LE Lift  Assume a hands and knees position on a firm surface. Keep your hands under your shoulders and your knees under your hips. You may place padding under your knees for comfort.  Find your neutral spine and gently tense your abdominal muscles so that you can maintain this position. Your shoulders and hips should form a rectangle  that is parallel with the floor and is not twisted.  Keeping your trunk steady, lift your right hand no higher than your shoulder and then your left leg no higher than your hip. Make sure you are not holding your breath. Hold this position __________ seconds.  Continuing to keep your abdominal muscles tense and your back steady, slowly return to your starting position. Repeat with the opposite arm and leg. Repeat __________ times. Complete this exercise __________ times per day.  STRENGTHENING - Abdominals and Quadriceps, Straight Leg Raise   Lie on a firm bed or floor with both legs extended in front of you.  Keeping one leg in contact with the floor, bend the other knee so that your foot can rest flat on the floor.  Find your neutral spine, and tense your abdominal muscles to maintain your spinal position throughout the exercise.  Slowly lift your straight leg off the floor about 6 inches for a count of 15, making sure to not hold your breath.  Still keeping your neutral spine, slowly lower your leg all the way to the floor. Repeat this exercise with each leg __________ times. Complete this exercise __________ times per day. POSTURE AND BODY MECHANICS CONSIDERATIONS - Sciatica Keeping correct posture when sitting, standing or completing your activities will reduce the stress put on different body tissues, allowing injured tissues a chance to heal and limiting painful experiences. The following are general guidelines for improved posture. Your physician or physical therapist will provide you with any instructions specific to your needs. While reading these guidelines, remember:  The exercises prescribed by your provider will help you have the flexibility and strength to maintain correct postures.  The correct posture provides the optimal environment for your joints to work. All of your joints have less wear and tear when properly supported by a spine with good posture. This means you will  experience a healthier, less painful body.  Correct posture must be practiced with all of your activities, especially prolonged sitting and standing. Correct posture is as important when doing repetitive low-stress activities (typing) as it is when doing a single heavy-load activity (lifting). RESTING POSITIONS Consider which positions are most painful for you when choosing a resting position. If you have pain with flexion-based activities (sitting, bending, stooping, squatting), choose a position that allows you to rest in a less flexed posture. You would want to avoid curling into a fetal position on your side. If your pain worsens with extension-based activities (prolonged standing, working overhead), avoid resting in an extended position such as sleeping on your stomach. Most people will find more comfort when they rest with their spine in a more neutral position, neither too rounded nor too   arched. Lying on a non-sagging bed on your side with a pillow between your knees, or on your back with a pillow under your knees will often provide some relief. Keep in mind, being in any one position for a prolonged period of time, no matter how correct your posture, can still lead to stiffness. PROPER SITTING POSTURE In order to minimize stress and discomfort on your spine, you must sit with correct posture Sitting with good posture should be effortless for a healthy body. Returning to good posture is a gradual process. Many people can work toward this most comfortably by using various supports until they have the flexibility and strength to maintain this posture on their own. When sitting with proper posture, your ears will fall over your shoulders and your shoulders will fall over your hips. You should use the back of the chair to support your upper back. Your low back will be in a neutral position, just slightly arched. You may place a small pillow or folded towel at the base of your low back for support.  When  working at a desk, create an environment that supports good, upright posture. Without extra support, muscles fatigue and lead to excessive strain on joints and other tissues. Keep these recommendations in mind: CHAIR:   A chair should be able to slide under your desk when your back makes contact with the back of the chair. This allows you to work closely.  The chair's height should allow your eyes to be level with the upper part of your monitor and your hands to be slightly lower than your elbows. BODY POSITION  Your feet should make contact with the floor. If this is not possible, use a foot rest.  Keep your ears over your shoulders. This will reduce stress on your neck and low back. INCORRECT SITTING POSTURES   If you are feeling tired and unable to assume a healthy sitting posture, do not slouch or slump. This puts excessive strain on your back tissues, causing more damage and pain. Healthier options include:  Using more support, like a lumbar pillow.  Switching tasks to something that requires you to be upright or walking.  Talking a brief walk.  Lying down to rest in a neutral-spine position. PROLONGED STANDING WHILE SLIGHTLY LEANING FORWARD  When completing a task that requires you to lean forward while standing in one place for a long time, place either foot up on a stationary 2-4 inch high object to help maintain the best posture. When both feet are on the ground, the low back tends to lose its slight inward curve. If this curve flattens (or becomes too large), then the back and your other joints will experience too much stress, fatigue more quickly and can cause pain.  CORRECT STANDING POSTURES Proper standing posture should be assumed with all daily activities, even if they only take a few moments, like when brushing your teeth. As in sitting, your ears should fall over your shoulders and your shoulders should fall over your hips. You should keep a slight tension in your abdominal  muscles to brace your spine. Your tailbone should point down to the ground, not behind your body, resulting in an over-extended swayback posture.  INCORRECT STANDING POSTURES  Common incorrect standing postures include a forward head, locked knees and/or an excessive swayback. WALKING Walk with an upright posture. Your ears, shoulders and hips should all line-up. PROLONGED ACTIVITY IN A FLEXED POSITION When completing a task that requires you to bend forward   at your waist or lean over a low surface, try to find a way to stabilize 3 of 4 of your limbs. You can place a hand or elbow on your thigh or rest a knee on the surface you are reaching across. This will provide you more stability so that your muscles do not fatigue as quickly. By keeping your knees relaxed, or slightly bent, you will also reduce stress across your low back. CORRECT LIFTING TECHNIQUES DO :   Assume a wide stance. This will provide you more stability and the opportunity to get as close as possible to the object which you are lifting.  Tense your abdominals to brace your spine; then bend at the knees and hips. Keeping your back locked in a neutral-spine position, lift using your leg muscles. Lift with your legs, keeping your back straight.  Test the weight of unknown objects before attempting to lift them.  Try to keep your elbows locked down at your sides in order get the best strength from your shoulders when carrying an object.  Always ask for help when lifting heavy or awkward objects. INCORRECT LIFTING TECHNIQUES DO NOT:   Lock your knees when lifting, even if it is a small object.  Bend and twist. Pivot at your feet or move your feet when needing to change directions.  Assume that you cannot safely pick up a paperclip without proper posture. Document Released: 02/08/2005 Document Revised: 06/25/2013 Document Reviewed: 05/23/2008 [employer] Patient Information 2015 [employer]. This information is not intended to  replace advice given to you by your health care provider. Make sure you discuss any questions you have with your health care provider.  

## 2014-05-13 NOTE — Progress Notes (Signed)
Anatomy scan normal female. C/O increased white, vaginal discharge w/ itching and increased cramping yesterday. No LOF or VB. Moderate amount of white, curd-like, odorless discharge on spec exam. Rx Terazol. 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 C/O sciatica pain w/ each pregnancy. No improvement w/ Chiropractor in past. Discussed PT, stretches, exercises, comfort measures, occasional ibuprofen (None after 28 weeks). Declines PT referral for now.

## 2014-05-14 ENCOUNTER — Telehealth: Payer: Self-pay | Admitting: *Deleted

## 2014-05-14 ENCOUNTER — Other Ambulatory Visit: Payer: Self-pay | Admitting: Advanced Practice Midwife

## 2014-05-14 DIAGNOSIS — N760 Acute vaginitis: Principal | ICD-10-CM

## 2014-05-14 DIAGNOSIS — B9689 Other specified bacterial agents as the cause of diseases classified elsewhere: Secondary | ICD-10-CM

## 2014-05-14 DIAGNOSIS — O0992 Supervision of high risk pregnancy, unspecified, second trimester: Secondary | ICD-10-CM

## 2014-05-14 LAB — WET PREP BY MOLECULAR PROBE
Candida species: POSITIVE — AB
Gardnerella vaginalis: POSITIVE — AB
TRICHOMONAS VAG: NEGATIVE

## 2014-05-14 MED ORDER — METRONIDAZOLE 500 MG PO TABS: 500.0000 mg | ORAL_TABLET | Freq: Two times a day (BID) | ORAL | Status: DC

## 2014-05-14 NOTE — Progress Notes (Signed)
BV. Fx Flagyl. Continue Terazol for VVC.

## 2014-05-14 NOTE — Telephone Encounter (Signed)
Pt notified of wet prep results and to continue with Terazol and RX for Flagyl was sent to her pharmacy.

## 2014-05-22 ENCOUNTER — Telehealth: Payer: Self-pay | Admitting: *Deleted

## 2014-05-22 NOTE — Telephone Encounter (Signed)
Pt called in stating she woke up with nasal congestion and sore throat. Went over OTC medications safe during pregnancy. Advised to treat Sx and if no better in a few days to come in for eval in office. Pt expressed understanding.

## 2014-06-03 ENCOUNTER — Other Ambulatory Visit (HOSPITAL_COMMUNITY): Payer: Self-pay | Admitting: Maternal and Fetal Medicine

## 2014-06-03 ENCOUNTER — Encounter (HOSPITAL_COMMUNITY): Payer: Self-pay

## 2014-06-03 ENCOUNTER — Ambulatory Visit (HOSPITAL_COMMUNITY)
Admission: RE | Admit: 2014-06-03 | Discharge: 2014-06-03 | Disposition: A | Discharge status: Home / Self Care | Payer: 59 | Source: Ambulatory Visit | Attending: Advanced Practice Midwife | Admitting: Advanced Practice Midwife

## 2014-06-03 DIAGNOSIS — O09292 Supervision of pregnancy with other poor reproductive or obstetric history, second trimester: Secondary | ICD-10-CM | POA: Insufficient documentation

## 2014-06-03 DIAGNOSIS — O99512 Diseases of the respiratory system complicating pregnancy, second trimester: Secondary | ICD-10-CM | POA: Diagnosis not present

## 2014-06-03 DIAGNOSIS — O09299 Supervision of pregnancy with other poor reproductive or obstetric history, unspecified trimester: Secondary | ICD-10-CM | POA: Insufficient documentation

## 2014-06-03 DIAGNOSIS — O99519 Diseases of the respiratory system complicating pregnancy, unspecified trimester: Secondary | ICD-10-CM

## 2014-06-03 DIAGNOSIS — J45909 Unspecified asthma, uncomplicated: Secondary | ICD-10-CM | POA: Diagnosis not present

## 2014-06-03 DIAGNOSIS — Z3A30 30 weeks gestation of pregnancy: Secondary | ICD-10-CM

## 2014-06-03 DIAGNOSIS — O09293 Supervision of pregnancy with other poor reproductive or obstetric history, third trimester: Secondary | ICD-10-CM

## 2014-06-03 DIAGNOSIS — Z3A24 24 weeks gestation of pregnancy: Secondary | ICD-10-CM | POA: Diagnosis not present

## 2014-06-10 ENCOUNTER — Ambulatory Visit (INDEPENDENT_AMBULATORY_CARE_PROVIDER_SITE_OTHER): Payer: 59 | Admitting: Advanced Practice Midwife

## 2014-06-10 VITALS — BP 116/66 | HR 87 | Wt 162.0 lb

## 2014-06-10 DIAGNOSIS — O09292 Supervision of pregnancy with other poor reproductive or obstetric history, second trimester: Secondary | ICD-10-CM

## 2014-06-10 NOTE — Patient Instructions (Signed)
Second Trimester of Pregnancy The second trimester is from week 13 through week 28, months 4 through 6. The second trimester is often a time when you feel your best. Your body has also adjusted to being pregnant, and you begin to feel better physically. Usually, morning sickness has lessened or quit completely, you may have more energy, and you may have an increase in appetite. The second trimester is also a time when the fetus is growing rapidly. At the end of the sixth month, the fetus is about 9 inches long and weighs about 1 pounds. You will likely begin to feel the baby move (quickening) between 18 and 20 weeks of the pregnancy. BODY CHANGES Your body goes through many changes during pregnancy. The changes vary from woman to woman.   Your weight will continue to increase. You will notice your lower abdomen bulging out.  You may begin to get stretch marks on your hips, abdomen, and breasts.  You may develop headaches that can be relieved by medicines approved by your health care provider.  You may urinate more often because the fetus is pressing on your bladder.  You may develop or continue to have heartburn as a result of your pregnancy.  You may develop constipation because certain hormones are causing the muscles that push waste through your intestines to slow down.  You may develop hemorrhoids or swollen, bulging veins (varicose veins).  You may have back pain because of the weight gain and pregnancy hormones relaxing your joints between the bones in your pelvis and as a result of a shift in weight and the muscles that support your balance.  Your breasts will continue to grow and be tender.  Your gums may bleed and may be sensitive to brushing and flossing.  Dark spots or blotches (chloasma, mask of pregnancy) may develop on your face. This will likely fade after the baby is born.  A dark line from your belly button to the pubic area (linea nigra) may appear. This will likely fade  after the baby is born.  You may have changes in your hair. These can include thickening of your hair, rapid growth, and changes in texture. Some women also have hair loss during or after pregnancy, or hair that feels dry or thin. Your hair will most likely return to normal after your baby is born. WHAT TO EXPECT AT YOUR PRENATAL VISITS During a routine prenatal visit:  You will be weighed to make sure you and the fetus are growing normally.  Your blood pressure will be taken.  Your abdomen will be measured to track your baby's growth.  The fetal heartbeat will be listened to.  Any test results from the previous visit will be discussed. Your health care provider may ask you:  How you are feeling.  If you are feeling the baby move.  If you have had any abnormal symptoms, such as leaking fluid, bleeding, severe headaches, or abdominal cramping.  If you have any questions. Other tests that may be performed during your second trimester include:  Blood tests that check for:  Low iron levels (anemia).  Gestational diabetes (between 24 and 28 weeks).  Rh antibodies.  Urine tests to check for infections, diabetes, or protein in the urine.  An ultrasound to confirm the proper growth and development of the baby.  An amniocentesis to check for possible genetic problems.  Fetal screens for spina bifida and Down syndrome. HOME CARE INSTRUCTIONS   Avoid all smoking, herbs, alcohol, and unprescribed   drugs. These chemicals affect the formation and growth of the baby.  Follow your health care provider's instructions regarding medicine use. There are medicines that are either safe or unsafe to take during pregnancy.  Exercise only as directed by your health care provider. Experiencing uterine cramps is a good sign to stop exercising.  Continue to eat regular, healthy meals.  Wear a good support bra for breast tenderness.  Do not use hot tubs, steam rooms, or saunas.  Wear your  seat belt at all times when driving.  Avoid raw meat, uncooked cheese, cat litter boxes, and soil used by cats. These carry germs that can cause birth defects in the baby.  Take your prenatal vitamins.  Try taking a stool softener (if your health care provider approves) if you develop constipation. Eat more high-fiber foods, such as fresh vegetables or fruit and whole grains. Drink plenty of fluids to keep your urine clear or pale yellow.  Take warm sitz baths to soothe any pain or discomfort caused by hemorrhoids. Use hemorrhoid cream if your health care provider approves.  If you develop varicose veins, wear support hose. Elevate your feet for 15 minutes, 3-4 times a day. Limit salt in your diet.  Avoid heavy lifting, wear low heel shoes, and practice good posture.  Rest with your legs elevated if you have leg cramps or low back pain.  Visit your dentist if you have not gone yet during your pregnancy. Use a soft toothbrush to brush your teeth and be gentle when you floss.  A sexual relationship may be continued unless your health care provider directs you otherwise.  Continue to go to all your prenatal visits as directed by your health care provider. SEEK MEDICAL CARE IF:   You have dizziness.  You have mild pelvic cramps, pelvic pressure, or nagging pain in the abdominal area.  You have persistent nausea, vomiting, or diarrhea.  You have a bad smelling vaginal discharge.  You have pain with urination. SEEK IMMEDIATE MEDICAL CARE IF:   You have a fever.  You are leaking fluid from your vagina.  You have spotting or bleeding from your vagina.  You have severe abdominal cramping or pain.  You have rapid weight gain or loss.  You have shortness of breath with chest pain.  You notice sudden or extreme swelling of your face, hands, ankles, feet, or legs.  You have not felt your baby move in over an hour.  You have severe headaches that do not go away with  medicine.  You have vision changes. Document Released: 02/02/2001 Document Revised: 02/13/2013 Document Reviewed: 04/11/2012 [employer] Patient Information 2015 [employer]. This information is not intended to replace advice given to you by your health care provider. Make sure you discuss any questions you have with your health care provider.  

## 2014-06-10 NOTE — Progress Notes (Signed)
Still some pain with sciatica. Will add heel lift to right shoe. Discussed back care.. Discussed round ligament pain and BH UCs. Plan IOL at 39 weeks, discussed. Glucola at next visit

## 2014-07-01 ENCOUNTER — Ambulatory Visit (INDEPENDENT_AMBULATORY_CARE_PROVIDER_SITE_OTHER): Payer: 59 | Admitting: Advanced Practice Midwife

## 2014-07-01 VITALS — BP 126/74 | HR 89 | Wt 166.0 lb

## 2014-07-01 DIAGNOSIS — Z36 Encounter for antenatal screening of mother: Secondary | ICD-10-CM

## 2014-07-01 DIAGNOSIS — O0992 Supervision of high risk pregnancy, unspecified, second trimester: Secondary | ICD-10-CM

## 2014-07-01 DIAGNOSIS — Z23 Encounter for immunization: Secondary | ICD-10-CM | POA: Diagnosis not present

## 2014-07-01 DIAGNOSIS — O09211 Supervision of pregnancy with history of pre-term labor, first trimester: Secondary | ICD-10-CM

## 2014-07-01 DIAGNOSIS — O09292 Supervision of pregnancy with other poor reproductive or obstetric history, second trimester: Secondary | ICD-10-CM

## 2014-07-01 NOTE — Patient Instructions (Signed)

## 2014-07-01 NOTE — Progress Notes (Signed)
Start weekly BPPs. Has growth US scheduled 5/23. Will try to move closer since she should be having them every four weeks.

## 2014-07-02 ENCOUNTER — Other Ambulatory Visit (HOSPITAL_COMMUNITY): Payer: Self-pay | Admitting: Maternal and Fetal Medicine

## 2014-07-02 ENCOUNTER — Encounter (HOSPITAL_COMMUNITY): Payer: Self-pay

## 2014-07-02 ENCOUNTER — Other Ambulatory Visit: Payer: Self-pay | Admitting: Advanced Practice Midwife

## 2014-07-02 ENCOUNTER — Ambulatory Visit (HOSPITAL_COMMUNITY)
Admission: RE | Admit: 2014-07-02 | Discharge: 2014-07-02 | Disposition: A | Discharge status: Home / Self Care | Payer: 59 | Source: Ambulatory Visit | Attending: Advanced Practice Midwife | Admitting: Advanced Practice Midwife

## 2014-07-02 DIAGNOSIS — Z36 Encounter for antenatal screening of mother: Secondary | ICD-10-CM | POA: Diagnosis present

## 2014-07-02 DIAGNOSIS — O09213 Supervision of pregnancy with history of pre-term labor, third trimester: Secondary | ICD-10-CM | POA: Diagnosis not present

## 2014-07-02 DIAGNOSIS — O09292 Supervision of pregnancy with other poor reproductive or obstetric history, second trimester: Secondary | ICD-10-CM

## 2014-07-02 DIAGNOSIS — O09293 Supervision of pregnancy with other poor reproductive or obstetric history, third trimester: Secondary | ICD-10-CM | POA: Diagnosis not present

## 2014-07-02 DIAGNOSIS — Z3A28 28 weeks gestation of pregnancy: Secondary | ICD-10-CM | POA: Insufficient documentation

## 2014-07-02 DIAGNOSIS — O09211 Supervision of pregnancy with history of pre-term labor, first trimester: Secondary | ICD-10-CM

## 2014-07-02 LAB — RPR

## 2014-07-02 LAB — HIV ANTIBODY (ROUTINE TESTING W REFLEX): HIV: NONREACTIVE

## 2014-07-02 LAB — GLUCOSE TOLERANCE, 1 HOUR (50G) W/O FASTING: Glucose, 1 Hour GTT: 96 mg/dL (ref 70–140)

## 2014-07-03 ENCOUNTER — Telehealth: Payer: Self-pay | Admitting: *Deleted

## 2014-07-03 NOTE — Telephone Encounter (Signed)
Called pt to adv nrml GTT

## 2014-07-10 ENCOUNTER — Encounter (HOSPITAL_COMMUNITY): Payer: Self-pay

## 2014-07-10 ENCOUNTER — Other Ambulatory Visit (HOSPITAL_COMMUNITY): Payer: Self-pay | Admitting: Maternal and Fetal Medicine

## 2014-07-10 ENCOUNTER — Ambulatory Visit (HOSPITAL_COMMUNITY)
Admission: RE | Admit: 2014-07-10 | Discharge: 2014-07-10 | Disposition: A | Discharge status: Home / Self Care | Payer: 59 | Source: Ambulatory Visit | Attending: Advanced Practice Midwife | Admitting: Advanced Practice Midwife

## 2014-07-10 DIAGNOSIS — J45909 Unspecified asthma, uncomplicated: Secondary | ICD-10-CM

## 2014-07-10 DIAGNOSIS — Z3A29 29 weeks gestation of pregnancy: Secondary | ICD-10-CM | POA: Diagnosis not present

## 2014-07-10 DIAGNOSIS — O99519 Diseases of the respiratory system complicating pregnancy, unspecified trimester: Secondary | ICD-10-CM

## 2014-07-10 DIAGNOSIS — O09293 Supervision of pregnancy with other poor reproductive or obstetric history, third trimester: Secondary | ICD-10-CM

## 2014-07-10 DIAGNOSIS — Z36 Encounter for antenatal screening of mother: Secondary | ICD-10-CM | POA: Diagnosis present

## 2014-07-10 DIAGNOSIS — O09211 Supervision of pregnancy with history of pre-term labor, first trimester: Secondary | ICD-10-CM

## 2014-07-10 DIAGNOSIS — O09292 Supervision of pregnancy with other poor reproductive or obstetric history, second trimester: Secondary | ICD-10-CM

## 2014-07-15 ENCOUNTER — Ambulatory Visit (INDEPENDENT_AMBULATORY_CARE_PROVIDER_SITE_OTHER): Payer: 59 | Admitting: Advanced Practice Midwife

## 2014-07-15 ENCOUNTER — Encounter (HOSPITAL_COMMUNITY): Payer: Self-pay

## 2014-07-15 ENCOUNTER — Ambulatory Visit (HOSPITAL_COMMUNITY)
Admission: RE | Admit: 2014-07-15 | Discharge: 2014-07-15 | Disposition: A | Discharge status: Home / Self Care | Payer: 59 | Source: Ambulatory Visit | Attending: Advanced Practice Midwife | Admitting: Advanced Practice Midwife

## 2014-07-15 VITALS — BP 113/69 | HR 99 | Wt 169.0 lb

## 2014-07-15 DIAGNOSIS — J45909 Unspecified asthma, uncomplicated: Secondary | ICD-10-CM

## 2014-07-15 DIAGNOSIS — O09293 Supervision of pregnancy with other poor reproductive or obstetric history, third trimester: Secondary | ICD-10-CM | POA: Insufficient documentation

## 2014-07-15 DIAGNOSIS — O99519 Diseases of the respiratory system complicating pregnancy, unspecified trimester: Secondary | ICD-10-CM

## 2014-07-17 ENCOUNTER — Encounter: Payer: Self-pay | Admitting: Advanced Practice Midwife

## 2014-07-17 NOTE — Patient Instructions (Signed)
Third Trimester of Pregnancy The third trimester is from week 29 through week 42, months 7 through 9. The third trimester is a time when the fetus is growing rapidly. At the end of the ninth month, the fetus is about 20 inches in length and weighs 6-10 pounds.  BODY CHANGES Your body goes through many changes during pregnancy. The changes vary from woman to woman.   Your weight will continue to increase. You can expect to gain 25-35 pounds (11-16 kg) by the end of the pregnancy.  You may begin to get stretch marks on your hips, abdomen, and breasts.  You may urinate more often because the fetus is moving lower into your pelvis and pressing on your bladder.  You may develop or continue to have heartburn as a result of your pregnancy.  You may develop constipation because certain hormones are causing the muscles that push waste through your intestines to slow down.  You may develop hemorrhoids or swollen, bulging veins (varicose veins).  You may have pelvic pain because of the weight gain and pregnancy hormones relaxing your joints between the bones in your pelvis. Backaches may result from overexertion of the muscles supporting your posture.  You may have changes in your hair. These can include thickening of your hair, rapid growth, and changes in texture. Some women also have hair loss during or after pregnancy, or hair that feels dry or thin. Your hair will most likely return to normal after your baby is born.  Your breasts will continue to grow and be tender. A yellow discharge may leak from your breasts called colostrum.  Your belly button may stick out.  You may feel short of breath because of your expanding uterus.  You may notice the fetus "dropping," or moving lower in your abdomen.  You may have a bloody mucus discharge. This usually occurs a few days to a week before labor begins.  Your cervix becomes thin and soft (effaced) near your due date. WHAT TO EXPECT AT YOUR PRENATAL  EXAMS  You will have prenatal exams every 2 weeks until week 36. Then, you will have weekly prenatal exams. During a routine prenatal visit:  You will be weighed to make sure you and the fetus are growing normally.  Your blood pressure is taken.  Your abdomen will be measured to track your baby's growth.  The fetal heartbeat will be listened to.  Any test results from the previous visit will be discussed.  You may have a cervical check near your due date to see if you have effaced. At around 36 weeks, your caregiver will check your cervix. At the same time, your caregiver will also perform a test on the secretions of the vaginal tissue. This test is to determine if a type of bacteria, Group B streptococcus, is present. Your caregiver will explain this further. Your caregiver may ask you:  What your birth plan is.  How you are feeling.  If you are feeling the baby move.  If you have had any abnormal symptoms, such as leaking fluid, bleeding, severe headaches, or abdominal cramping.  If you have any questions. Other tests or screenings that may be performed during your third trimester include:  Blood tests that check for low iron levels (anemia).  Fetal testing to check the health, activity level, and growth of the fetus. Testing is done if you have certain medical conditions or if there are problems during the pregnancy. FALSE LABOR You may feel small, irregular contractions that   eventually go away. These are called Braxton Hicks contractions, or false labor. Contractions may last for hours, days, or even weeks before true labor sets in. If contractions come at regular intervals, intensify, or become painful, it is best to be seen by your caregiver.  SIGNS OF LABOR   Menstrual-like cramps.  Contractions that are 5 minutes apart or less.  Contractions that start on the top of the uterus and spread down to the lower abdomen and back.  A sense of increased pelvic pressure or back  pain.  A watery or bloody mucus discharge that comes from the vagina. If you have any of these signs before the 37th week of pregnancy, call your caregiver right away. You need to go to the hospital to get checked immediately. HOME CARE INSTRUCTIONS   Avoid all smoking, herbs, alcohol, and unprescribed drugs. These chemicals affect the formation and growth of the baby.  Follow your caregiver's instructions regarding medicine use. There are medicines that are either safe or unsafe to take during pregnancy.  Exercise only as directed by your caregiver. Experiencing uterine cramps is a good sign to stop exercising.  Continue to eat regular, healthy meals.  Wear a good support bra for breast tenderness.  Do not use hot tubs, steam rooms, or saunas.  Wear your seat belt at all times when driving.  Avoid raw meat, uncooked cheese, cat litter boxes, and soil used by cats. These carry germs that can cause birth defects in the baby.  Take your prenatal vitamins.  Try taking a stool softener (if your caregiver approves) if you develop constipation. Eat more high-fiber foods, such as fresh vegetables or fruit and whole grains. Drink plenty of fluids to keep your urine clear or pale yellow.  Take warm sitz baths to soothe any pain or discomfort caused by hemorrhoids. Use hemorrhoid cream if your caregiver approves.  If you develop varicose veins, wear support hose. Elevate your feet for 15 minutes, 3-4 times a day. Limit salt in your diet.  Avoid heavy lifting, wear low heal shoes, and practice good posture.  Rest a lot with your legs elevated if you have leg cramps or low back pain.  Visit your dentist if you have not gone during your pregnancy. Use a soft toothbrush to brush your teeth and be gentle when you floss.  A sexual relationship may be continued unless your caregiver directs you otherwise.  Do not travel far distances unless it is absolutely necessary and only with the approval  of your caregiver.  Take prenatal classes to understand, practice, and ask questions about the labor and delivery.  Make a trial run to the hospital.  Pack your hospital bag.  Prepare the baby's nursery.  Continue to go to all your prenatal visits as directed by your caregiver. SEEK MEDICAL CARE IF:  You are unsure if you are in labor or if your water has broken.  You have dizziness.  You have mild pelvic cramps, pelvic pressure, or nagging pain in your abdominal area.  You have persistent nausea, vomiting, or diarrhea.  You have a bad smelling vaginal discharge.  You have pain with urination. SEEK IMMEDIATE MEDICAL CARE IF:   You have a fever.  You are leaking fluid from your vagina.  You have spotting or bleeding from your vagina.  You have severe abdominal cramping or pain.  You have rapid weight loss or gain.  You have shortness of breath with chest pain.  You notice sudden or extreme swelling   of your face, hands, ankles, feet, or legs.  You have not felt your baby move in over an hour.  You have severe headaches that do not go away with medicine.  You have vision changes. Document Released: 02/02/2001 Document Revised: 02/13/2013 Document Reviewed: 04/11/2012 [employer] Patient Information 2015 [employer]. This information is not intended to replace advice given to you by your health care provider. Make sure you discuss any questions you have with your health care provider.  

## 2014-07-17 NOTE — Progress Notes (Signed)
Doing well 

## 2014-07-24 ENCOUNTER — Ambulatory Visit (HOSPITAL_COMMUNITY)
Admission: RE | Admit: 2014-07-24 | Discharge: 2014-07-24 | Disposition: A | Discharge status: Home / Self Care | Payer: 59 | Source: Ambulatory Visit | Attending: Advanced Practice Midwife | Admitting: Advanced Practice Midwife

## 2014-07-24 DIAGNOSIS — Z3A31 31 weeks gestation of pregnancy: Secondary | ICD-10-CM | POA: Insufficient documentation

## 2014-07-24 DIAGNOSIS — O09211 Supervision of pregnancy with history of pre-term labor, first trimester: Secondary | ICD-10-CM

## 2014-07-24 DIAGNOSIS — O09293 Supervision of pregnancy with other poor reproductive or obstetric history, third trimester: Secondary | ICD-10-CM | POA: Insufficient documentation

## 2014-07-24 DIAGNOSIS — O09292 Supervision of pregnancy with other poor reproductive or obstetric history, second trimester: Secondary | ICD-10-CM

## 2014-07-25 ENCOUNTER — Telehealth: Payer: Self-pay | Admitting: *Deleted

## 2014-07-25 NOTE — Telephone Encounter (Signed)
Pt sates she has noticed she has started craving ice chips and wanted to know if that was okay. I adv pt that she needs to continue PNV and add OTC iron 325mg  each day. We will check CMET and CBC when she comes in at next appt.

## 2014-07-29 ENCOUNTER — Encounter: Payer: Self-pay | Admitting: Advanced Practice Midwife

## 2014-07-29 ENCOUNTER — Ambulatory Visit (INDEPENDENT_AMBULATORY_CARE_PROVIDER_SITE_OTHER): Payer: 59 | Admitting: Advanced Practice Midwife

## 2014-07-29 VITALS — BP 128/76 | HR 99 | Wt 172.0 lb

## 2014-07-29 DIAGNOSIS — F5089 Other specified eating disorder: Secondary | ICD-10-CM

## 2014-07-29 DIAGNOSIS — F508 Other eating disorders: Secondary | ICD-10-CM

## 2014-07-29 DIAGNOSIS — O09293 Supervision of pregnancy with other poor reproductive or obstetric history, third trimester: Secondary | ICD-10-CM | POA: Diagnosis not present

## 2014-07-29 LAB — FETAL FIBRONECTIN: FETAL FIBRONECTIN: NEGATIVE

## 2014-07-29 NOTE — Progress Notes (Signed)
Increased pelvic pressure and ~2 UC's per hour x 2 weeks. No LOF, BV, vaginal discharge. fFN collected. Possible increased urinary frequency, but no other complaints. Urine dip neg. PTL precautions.  Next growth US not scheduled until 6/23. Will move growth US closer to have them Q4 weeks. Start twice weekly testing today. Going on vacation next week. Travel precautions. Discussed that this will cause her to miss recommended antenatal testing. Pt verbalizes understanding. Will get NST immediately before and after trip. Records given.  NST reactive.

## 2014-07-29 NOTE — Patient Instructions (Signed)
Braxton Hicks Contractions °Contractions of the uterus can occur throughout pregnancy. Contractions are not always a sign that you are in labor.  °WHAT ARE BRAXTON HICKS CONTRACTIONS?  °Contractions that occur before labor are called Braxton Hicks contractions, or false labor. Toward the end of pregnancy (32-34 weeks), these contractions can develop more often and may become more forceful. This is not true labor because these contractions do not result in opening (dilatation) and thinning of the cervix. They are sometimes difficult to tell apart from true labor because these contractions can be forceful and people have different pain tolerances. You should not feel embarrassed if you go to the hospital with false labor. Sometimes, the only way to tell if you are in true labor is for your health care provider to look for changes in the cervix. °If there are no prenatal problems or other health problems associated with the pregnancy, it is completely safe to be sent home with false labor and await the onset of true labor. °HOW CAN YOU TELL THE DIFFERENCE BETWEEN TRUE AND FALSE LABOR? °False Labor °· The contractions of false labor are usually shorter and not as hard as those of true labor.   °· The contractions are usually irregular.   °· The contractions are often felt in the front of the lower abdomen and in the groin.   °· The contractions may go away when you walk around or change positions while lying down.   °· The contractions get weaker and are shorter lasting as time goes on.   °· The contractions do not usually become progressively stronger, regular, and closer together as with true labor.   °True Labor °1. Contractions in true labor last 30-70 seconds, become very regular, usually become more intense, and increase in frequency.   °2. The contractions do not go away with walking.   °3. The discomfort is usually felt in the top of the uterus and spreads to the lower abdomen and low back.   °4. True labor can  be determined by your health care provider with an exam. This will show that the cervix is dilating and getting thinner.   °WHAT TO REMEMBER °· Keep up with your usual exercises and follow other instructions given by your health care provider.   °· Take medicines as directed by your health care provider.   °· Keep your regular prenatal appointments.   °· Eat and drink lightly if you think you are going into labor.   °· If Braxton Hicks contractions are making you uncomfortable:   °· Change your position from lying down or resting to walking, or from walking to resting.   °· Sit and rest in a tub of warm water.   °· Drink 2-3 glasses of water. Dehydration may cause these contractions.   °· Do slow and deep breathing several times an hour.   °WHEN SHOULD I SEEK IMMEDIATE MEDICAL CARE? °Seek immediate medical care if: °· Your contractions become stronger, more regular, and closer together.   °· You have fluid leaking or gushing from your vagina.   °· You have a fever.   °· You pass blood-tinged mucus.   °· You have vaginal bleeding.   °· You have continuous abdominal pain.   °· You have low back pain that you never had before.   °· You feel your baby's head pushing down and causing pelvic pressure.   °· Your baby is not moving as much as it used to.   °Document Released: 02/08/2005 Document Revised: 02/13/2013 Document Reviewed: 11/20/2012 °[employer]® Patient Information ©2015 [employer]. This information is not intended to replace advice given to you by your health care   provider. Make sure you discuss any questions you have with your health care provider. ° °Fetal Movement Counts °Patient Name: __________________________________________________ Patient Due Date: ____________________ °Performing a fetal movement count is highly recommended in high-risk pregnancies, but it is good for every pregnant woman to do. Your health care provider may ask you to start counting fetal movements at 28 weeks of the pregnancy. Fetal  movements often increase: °· After eating a full meal. °· After physical activity. °· After eating or drinking something sweet or cold. °· At rest. °Pay attention to when you feel the baby is most active. This will help you notice a pattern of your baby's sleep and wake cycles and what factors contribute to an increase in fetal movement. It is important to perform a fetal movement count at the same time each day when your baby is normally most active.  °HOW TO COUNT FETAL MOVEMENTS °5. Find a quiet and comfortable area to sit or lie down on your left side. Lying on your left side provides the best blood and oxygen circulation to your baby. °6. Write down the day and time on a sheet of paper or in a journal. °7. Start counting kicks, flutters, swishes, rolls, or jabs in a 2-hour period. You should feel at least 10 movements within 2 hours. °8. If you do not feel 10 movements in 2 hours, wait 2-3 hours and count again. Look for a change in the pattern or not enough counts in 2 hours. °SEEK MEDICAL CARE IF: °· You feel less than 10 counts in 2 hours, tried twice. °· There is no movement in over an hour. °· The pattern is changing or taking longer each day to reach 10 counts in 2 hours. °· You feel the baby is not moving as he or she usually does. °Date: ____________ Movements: ____________ Start time: ____________ Finish time: ____________  °Date: ____________ Movements: ____________ Start time: ____________ Finish time: ____________ °Date: ____________ Movements: ____________ Start time: ____________ Finish time: ____________ °Date: ____________ Movements: ____________ Start time: ____________ Finish time: ____________ °Date: ____________ Movements: ____________ Start time: ____________ Finish time: ____________ °Date: ____________ Movements: ____________ Start time: ____________ Finish time: ____________ °Date: ____________ Movements: ____________ Start time: ____________ Finish time: ____________ °Date: ____________  Movements: ____________ Start time: ____________ Finish time: ____________  °Date: ____________ Movements: ____________ Start time: ____________ Finish time: ____________ °Date: ____________ Movements: ____________ Start time: ____________ Finish time: ____________ °Date: ____________ Movements: ____________ Start time: ____________ Finish time: ____________ °Date: ____________ Movements: ____________ Start time: ____________ Finish time: ____________ °Date: ____________ Movements: ____________ Start time: ____________ Finish time: ____________ °Date: ____________ Movements: ____________ Start time: ____________ Finish time: ____________ °Date: ____________ Movements: ____________ Start time: ____________ Finish time: ____________  °Date: ____________ Movements: ____________ Start time: ____________ Finish time: ____________ °Date: ____________ Movements: ____________ Start time: ____________ Finish time: ____________ °Date: ____________ Movements: ____________ Start time: ____________ Finish time: ____________ °Date: ____________ Movements: ____________ Start time: ____________ Finish time: ____________ °Date: ____________ Movements: ____________ Start time: ____________ Finish time: ____________ °Date: ____________ Movements: ____________ Start time: ____________ Finish time: ____________ °Date: ____________ Movements: ____________ Start time: ____________ Finish time: ____________  °Date: ____________ Movements: ____________ Start time: ____________ Finish time: ____________ °Date: ____________ Movements: ____________ Start time: ____________ Finish time: ____________ °Date: ____________ Movements: ____________ Start time: ____________ Finish time: ____________ °Date: ____________ Movements: ____________ Start time: ____________ Finish time: ____________ °Date: ____________ Movements: ____________ Start time: ____________ Finish time: ____________ °Date: ____________ Movements: ____________ Start time:  ____________ Finish time: ____________ °Date: ____________ Movements:   ____________ Start time: ____________ Finish time: ____________  °Date: ____________ Movements: ____________ Start time: ____________ Finish time: ____________ °Date: ____________ Movements: ____________ Start time: ____________ Finish time: ____________ °Date: ____________ Movements: ____________ Start time: ____________ Finish time: ____________ °Date: ____________ Movements: ____________ Start time: ____________ Finish time: ____________ °Date: ____________ Movements: ____________ Start time: ____________ Finish time: ____________ °Date: ____________ Movements: ____________ Start time: ____________ Finish time: ____________ °Date: ____________ Movements: ____________ Start time: ____________ Finish time: ____________  °Date: ____________ Movements: ____________ Start time: ____________ Finish time: ____________ °Date: ____________ Movements: ____________ Start time: ____________ Finish time: ____________ °Date: ____________ Movements: ____________ Start time: ____________ Finish time: ____________ °Date: ____________ Movements: ____________ Start time: ____________ Finish time: ____________ °Date: ____________ Movements: ____________ Start time: ____________ Finish time: ____________ °Date: ____________ Movements: ____________ Start time: ____________ Finish time: ____________ °Date: ____________ Movements: ____________ Start time: ____________ Finish time: ____________  °Date: ____________ Movements: ____________ Start time: ____________ Finish time: ____________ °Date: ____________ Movements: ____________ Start time: ____________ Finish time: ____________ °Date: ____________ Movements: ____________ Start time: ____________ Finish time: ____________ °Date: ____________ Movements: ____________ Start time: ____________ Finish time: ____________ °Date: ____________ Movements: ____________ Start time: ____________ Finish time: ____________ °Date:  ____________ Movements: ____________ Start time: ____________ Finish time: ____________ °Date: ____________ Movements: ____________ Start time: ____________ Finish time: ____________  °Date: ____________ Movements: ____________ Start time: ____________ Finish time: ____________ °Date: ____________ Movements: ____________ Start time: ____________ Finish time: ____________ °Date: ____________ Movements: ____________ Start time: ____________ Finish time: ____________ °Date: ____________ Movements: ____________ Start time: ____________ Finish time: ____________ °Date: ____________ Movements: ____________ Start time: ____________ Finish time: ____________ °Date: ____________ Movements: ____________ Start time: ____________ Finish time: ____________ °Document Released: 03/10/2006 Document Revised: 06/25/2013 Document Reviewed: 12/06/2011 °[employer]® Patient Information ©2015 [employer]. This information is not intended to replace advice given to you by your health care provider. Make sure you discuss any questions you have with your health care provider. ° °

## 2014-07-30 ENCOUNTER — Telehealth: Payer: Self-pay | Admitting: *Deleted

## 2014-07-30 NOTE — Telephone Encounter (Signed)
Pt notified of neg FFN

## 2014-07-31 ENCOUNTER — Ambulatory Visit (HOSPITAL_COMMUNITY): Payer: 59

## 2014-07-31 LAB — CBC

## 2014-08-01 ENCOUNTER — Telehealth: Payer: Self-pay | Admitting: *Deleted

## 2014-08-01 ENCOUNTER — Ambulatory Visit (HOSPITAL_COMMUNITY)
Admission: RE | Admit: 2014-08-01 | Discharge: 2014-08-01 | Disposition: A | Discharge status: Home / Self Care | Payer: 59 | Source: Ambulatory Visit | Attending: Advanced Practice Midwife | Admitting: Advanced Practice Midwife

## 2014-08-01 ENCOUNTER — Ambulatory Visit (HOSPITAL_COMMUNITY): Payer: 59

## 2014-08-01 DIAGNOSIS — Z3A32 32 weeks gestation of pregnancy: Secondary | ICD-10-CM | POA: Insufficient documentation

## 2014-08-01 DIAGNOSIS — O09293 Supervision of pregnancy with other poor reproductive or obstetric history, third trimester: Secondary | ICD-10-CM | POA: Diagnosis not present

## 2014-08-01 DIAGNOSIS — O09299 Supervision of pregnancy with other poor reproductive or obstetric history, unspecified trimester: Secondary | ICD-10-CM | POA: Insufficient documentation

## 2014-08-01 NOTE — Telephone Encounter (Signed)
-----   Message from [place], [place] sent at 08/01/2014  8:21 AM EDT ----- Regarding: CBC Next times she comes in, will have draw CBC? She didn't get on with her 28 week labs and has been having significant Pica. I ordered it, but it didn't get drawn somehow at last visit.

## 2014-08-01 NOTE — Addendum Note (Signed)
Addended by: Arne Cleveland on: 08/01/2014 12:17 PM   Modules accepted: Orders, Medications

## 2014-08-02 LAB — CBC
HCT: 35.7 % — ABNORMAL LOW (ref 36.0–46.0)
Hemoglobin: 12 g/dL (ref 12.0–15.0)
MCH: 31.5 pg (ref 26.0–34.0)
MCHC: 33.6 g/dL (ref 30.0–36.0)
MCV: 93.7 fL (ref 78.0–100.0)
MPV: 10.2 fL (ref 8.6–12.4)
Platelets: 272 10*3/uL (ref 150–400)
RBC: 3.81 MIL/uL — ABNORMAL LOW (ref 3.87–5.11)
RDW: 13.6 % (ref 11.5–15.5)
WBC: 9.4 10*3/uL (ref 4.0–10.5)

## 2014-08-12 ENCOUNTER — Ambulatory Visit (INDEPENDENT_AMBULATORY_CARE_PROVIDER_SITE_OTHER): Payer: 59 | Admitting: Advanced Practice Midwife

## 2014-08-12 VITALS — BP 120/70 | HR 92 | Wt 175.0 lb

## 2014-08-12 DIAGNOSIS — Z3483 Encounter for supervision of other normal pregnancy, third trimester: Secondary | ICD-10-CM

## 2014-08-12 DIAGNOSIS — O09293 Supervision of pregnancy with other poor reproductive or obstetric history, third trimester: Secondary | ICD-10-CM | POA: Diagnosis not present

## 2014-08-12 NOTE — Patient Instructions (Signed)
Breastfeeding Challenges and Solutions Even though breastfeeding is natural, it can be challenging, especially in the first few weeks after childbirth. It is normal for problems to arise when starting to breastfeed your new baby, even if you have breastfed before. This document provides some solutions to the most common breastfeeding challenges.  CHALLENGES AND SOLUTIONS Challenge--Cracked or Sore Nipples Cracked or sore nipples are commonly experienced by breastfeeding mothers. Cracked or sore nipples often are caused by inadequate latching (when your baby's mouth attaches to your breast to breastfeed). Soreness can also happen if your baby is not positioned properly at your breast. Although nipple cracking and soreness are common during the first week after birth, nipple pain is never normal. If you experience nipple cracking or soreness that lasts longer than 1 week or nipple pain, call your health care provider or lactation consultant.  Solution Ensure proper latching and positioning of your baby by following the steps below:  Find a comfortable place to sit or lie down, with your neck and back well supported.  Place a pillow or rolled up blanket under your baby to bring him or her to the level of your breast (if you are seated).  Make sure that your baby's abdomen is facing your abdomen.  Gently massage your breast. With your fingertips, massage from your chest wall toward your nipple in a circular motion. This encourages milk flow. You may need to continue this action during the feeding if your milk flows slowly.  Support your breast with 4 fingers underneath and your thumb above your nipple. Make sure your fingers are well away from your nipple and your baby's mouth.  Stroke your baby's lips gently with your finger or nipple.  When your baby's mouth is open wide enough, quickly bring your baby to your breast, placing your entire nipple and as much of the colored area around your nipple  (areola) as possible into your baby's mouth.  More areola should be visible above your baby's upper lip than below the lower lip.  Your baby's tongue should be between his or her lower gum and your breast.  Ensure that your baby's mouth is correctly positioned around your nipple (latched). Your baby's lips should create a seal on your breast and be turned out (everted).  It is common for your baby to suck for about 2-3 minutes in order to start the flow of breast milk. Signs that your baby has successfully latched on to your nipple include:   Quietly tugging or quietly sucking without causing you pain.   Swallowing heard between every 3-4 sucks.   Muscle movement above and in front of his or her ears with sucking.  Signs that your baby has not successfully latched on to nipple include:   Sucking sounds or smacking sounds from your baby while nursing.   Nipple pain.  Ensure that your breasts stay moisturized and healthy by:  Avoiding the use of soap on your nipples.   Wearing a supportive bra. Avoid wearing underwire-style bras or tight bras.   Air drying your nipples for 3-4 minutes after each feeding.   Using only cotton bra pads to absorb breast milk leakage. Leaking of breast milk between feedings is normal. Be sure to change the pads if they become soaked with milk.  Using lanolin on your nipples after nursing. Lanolin helps to maintain your skin's normal moisture barrier. If you use pure lanolin you do not need to wash it off before feeding your baby again. Pure lanolin   is not toxic to your baby. You may also hand express a few drops of breast milk and gently massage that milk into your nipples, allowing it to air dry. Challenge--Breast Engorgement Breast engorgement is the overfilling of your breasts with breast milk. In the first few weeks after giving birth, you may experience breast engorgement. Breast engorgement can make your breasts throb and feel hard, tightly  stretched, warm, and tender. Engorgement peaks about the fifth day after you give birth. Having breast engorgement does not mean you have to stop breastfeeding your baby. Solution  Breastfeed when you feel the need to reduce the fullness of your breasts or when your baby shows signs of hunger. This is called "breastfeeding on demand."  Newborns (babies younger than 4 weeks) often breastfeed every 1-3 hours during the day. You may need to awaken your baby to feed if he or she is asleep at a feeding time.  Do not allow your baby to sleep longer than 5 hours during the night without a feeding.  Pump or hand express breast milk before breastfeeding to soften your breast, areola, and nipple.  Apply warm, moist heat (in the shower or with warm water-soaked hand towels) just before feeding or pumping, or massage your breast before or during breastfeeding. This increases circulation and helps your milk to flow.  Completely empty your breasts when breastfeeding or pumping. Afterward, wear a snug bra (nursing or regular) or tank top for 1-2 days to signal your body to slightly decrease milk production. Only wear snug bras or tank tops to treat engorgement. Tight bras typically should be avoided by breastfeeding mothers. Once engorgement is relieved, return to wearing regular, loose-fitting clothes.  Apply ice packs to your breasts to lessen the pain from engorgement and relieve swelling, unless the ice is uncomfortable for you.  Do not delay feedings. Try to relax when it is time to feed your baby. This helps to trigger your "let-down reflex," which releases milk from your breast.  Ensure your baby is latched on to your breast and positioned properly while breastfeeding.  Allow your baby to remain at your breast as long as he or she is latched on well and actively sucking. Your baby will let you know when he or she is done breastfeeding by pulling away from your breast or falling asleep.  Avoid  introducing bottles or pacifiers to your baby in the early weeks of breastfeeding. Wait to introduce these things until after resolving any breastfeeding challenges.  Try to pump your milk on the same schedule as when your baby would breastfeed if you are returning to work or away from home for an extended period.  Drink plenty of fluids to avoid dehydration, which can eventually put you at greater risk of breast engorgement. If you follow these suggestions, your engorgement should improve in 24-48 hours. If you are still experiencing difficulty, call your lactation consultant or health care provider.  Challenge--Plugged Milk Ducts Plugged milk ducts occur when the duct does not drain milk effectively and becomes swollen. Wearing a tight-fitting nursing bra or having difficulty with latching may cause plugged milk ducts. Not drinking enough water (8-10 c [1.9-2.4 L] per day) can contribute to plugged milk ducts. Once a duct has become plugged, hard lumps, soreness, and redness may develop in your breast.  Solution Do not delay feedings. Feed your baby frequently and try to empty your breasts of milk at each feeding. Try breastfeeding from the affected side first so there is a   better chance that the milk will drain completely from that breast. Apply warm, moist towels to your breasts for 5-10 minutes before feeding. Alternatively, a hot shower right before breastfeeding can provide the moist heat that can encourage milk flow. Gentle massage of the sore area before and during a feeding may also help. Avoid wearing tight clothing or bras that put pressure on your breasts. Wear bras that offer good support to your breasts, but avoid underwire bras. If you have a plugged milk duct and develop a fever, you need to see your health care provider.  Challenge--Mastitis Mastitis is inflammation of your breast. It usually is caused by a bacterial infection and can cause flu-like symptoms. You may develop redness in  your breast and a fever. Often when mastitis occurs, your breast becomes firm, warm, and very painful. The most common causes of mastitis are poor latching, ineffective sucking from your baby, consistent pressure on your breast (possibly from wearing a tight-fitting bra or shirt that restricts the milk flow), unusual stress or fatigue, or missed feedings.  Solution You will be given antibiotic medicine to treat the infection. It is still important to breastfeed frequently to empty your breasts. Continuing to breastfeed while you recover from mastitis will not harm your baby. Make sure your baby is positioned properly during every feeding. Apply moist heat to your breasts for a few minutes before feeding to help the milk flow and to help your breasts empty more easily. Challenge--Thrush Thrush is a yeast infection that can form on your nipples, in your breast, or in your baby's mouth. It causes itching, soreness, burning or stabbing pain, and sometimes a rash.  Solution You will be given a medicated ointment for your nipples, and your baby will be given a liquid medicine for his or her mouth. It is important that you and your baby are treated at the same time because thrush can be passed between you and your baby. Change disposable nursing pads often. Any bras, towels, or clothing that come in contact with infected areas of your body or your baby's body need to be washed in very hot water every day. Wash your hands and your baby's hands often. All pacifiers, bottle nipples, or toys your baby puts in his or her mouth should be boiled once a day for 20 minutes. After 1 week of treatment, discard pacifiers and bottle nipples and buy new ones. All breast pump parts that touch the milk need to be boiled for 20 minutes every day. Challenge--Low Milk Supply You may not be producing enough milk if your baby is not gaining the proper amount of weight. Breast milk production is based on a supply-and-demand system. Your  milk supply depends on how frequently and effectively your baby empties your breast. Solution The more you breastfeed and pump, the more breast milk you will produce. It is important that your baby empties at least one of your breasts at each feeding. If this is not happening, then use a breast pump or hand express any milk that remains. This will help to drain as much milk as possible at each feeding. It will also signal your body to produce more milk. If your baby is not emptying your breasts, it may be due to latching, sucking, or positioning problems. If low milk supply continues after addressing these issues, contact your health care provider or a lactation specialist as soon as possible. Challenge--Inverted or Flat Nipples Some women have nipples that turn inward instead of protruding outward.   Other women have nipples that are flat. Inverted or flat nipples can sometimes make it more difficult for your baby to latch onto your breast. Solution You may be given a small device that pulls out inverted nipples. This device should be applied right before your baby is brought to your breast. You can also try using a breast pump for a short time before placing the baby at your breast. The pump can pull your nipple outwards to help your infant latch more easily. The baby's sucking motion will help the inverted nipple protrude as well.  If you have flat nipples, encourage your baby to latch onto your breast and feed frequently in the early days after birth. This will give your baby practice latching on correctly while your breast is still soft. When your milk supply increases, between the second and fifth day after birth and your breasts become full, your baby will have an easier time latching.  Contact a lactation consultant if you still have concerns. She or he can teach you additional techniques to address breastfeeding problems related to nipple shape and position.  FOR MORE INFORMATION [employer]: www.llli.org Document Released: 08/02/2005 Document Revised: 02/13/2013 Document Reviewed: 08/04/2012 [employer] Patient Information 2015 [employer]. This information is not intended to replace advice given to you by your health care provider. Make sure you discuss any questions you have with your health care provider.  

## 2014-08-12 NOTE — Progress Notes (Signed)
Normal growth [place] 6/9. NST reactive today  Subjective:  Emma Walker is a 34 y.o. [phone] at [redacted]w[redacted]d being seen today for ongoing prenatal care.  Patient reports no bleeding, no contractions and no leaking.  Contractions: Irregular.  Vag. Bleeding: None. Movement: Present. Denies leaking of fluid.   The following portions of the patient's history were reviewed and updated as appropriate: allergies, current medications, past family history, past medical history, past social history, past surgical history and problem list.   Objective:   Filed Vitals:   08/12/14 1029  BP: 120/70  Pulse: 92  Weight: 175 lb (79.379 kg)    Fetal Status: Fetal Heart Rate (bpm): NST reactive   Movement: Present    AFI 15 cm  General:  Alert, oriented and cooperative. Patient is in no acute distress.  Skin: Skin is warm and dry. No rash noted.   Cardiovascular: Normal heart rate noted  Respiratory: Effort and breath sounds normal, no problems with respiration noted  Abdomen: Soft, gravid, appropriate for gestational age. Pain/Pressure: Present     Vaginal: Vag. Bleeding: None.    Vag D/C Character: Thin  Cervix: Not evaluated       Extremities: Normal range of motion.  Edema: Trace  Mental Status: Normal mood and affect. Normal behavior. Normal judgment and thought content.   Urinalysis:    Neg  Assessment and Plan:  Pregnancy: [id number] at [redacted]w[redacted]d  1. Hx of intrauterine fetal death, currently pregnant, third trimester  - US OB Follow Up; Future   Preterm labor symptoms and general obstetric precautions including but not limited to vaginal bleeding, contractions, leaking of fluid and fetal movement were reviewed in detail with the patient.  Please refer to After Visit Summary for other counseling recommendations.   Continue twice weekly testing and growth [place] q 4 weeks.  Dorathy Kinsman, CNM .

## 2014-08-12 NOTE — Progress Notes (Signed)
Bedside ultrasound AFI 17.5.

## 2014-08-15 ENCOUNTER — Ambulatory Visit (HOSPITAL_COMMUNITY): Payer: 59

## 2014-08-15 ENCOUNTER — Ambulatory Visit (INDEPENDENT_AMBULATORY_CARE_PROVIDER_SITE_OTHER): Payer: 59 | Admitting: *Deleted

## 2014-08-15 VITALS — BP 105/59 | HR 99

## 2014-08-15 DIAGNOSIS — O09293 Supervision of pregnancy with other poor reproductive or obstetric history, third trimester: Secondary | ICD-10-CM

## 2014-08-19 ENCOUNTER — Ambulatory Visit (INDEPENDENT_AMBULATORY_CARE_PROVIDER_SITE_OTHER): Payer: Commercial Managed Care - HMO | Admitting: Advanced Practice Midwife

## 2014-08-19 VITALS — BP 115/61 | HR 92

## 2014-08-19 DIAGNOSIS — O09293 Supervision of pregnancy with other poor reproductive or obstetric history, third trimester: Secondary | ICD-10-CM | POA: Diagnosis not present

## 2014-08-19 DIAGNOSIS — O368131 Decreased fetal movements, third trimester, fetus 1: Secondary | ICD-10-CM

## 2014-08-19 DIAGNOSIS — O4703 False labor before 37 completed weeks of gestation, third trimester: Secondary | ICD-10-CM

## 2014-08-19 DIAGNOSIS — O0992 Supervision of high risk pregnancy, unspecified, second trimester: Secondary | ICD-10-CM

## 2014-08-19 DIAGNOSIS — O0993 Supervision of high risk pregnancy, unspecified, third trimester: Secondary | ICD-10-CM

## 2014-08-19 NOTE — Progress Notes (Signed)
Bedside US shows AFI 17cm

## 2014-08-19 NOTE — Progress Notes (Signed)
NST reactive. AFI normal.  Subjective:  Emma Walker is a 34 y.o. [phone] at 2236w2d being seen today for ongoing prenatal care.  Patient reports occasional contractions.  Contractions: Irregular.  Vag. Bleeding: None. Movement: Present. Denies leaking of fluid.   The following portions of the patient's history were reviewed and updated as appropriate: allergies, current medications, past family history, past medical history, past social history, past surgical history and problem list.   Objective:   Filed Vitals:   08/19/14 1042  BP: 115/61  Pulse: 92    Fetal Status: Fetal Heart Rate (bpm): NST   Movement: Present     General:  Alert, oriented and cooperative. Patient is in no acute distress.  Skin: Skin is warm and dry. No rash noted.   Cardiovascular: Normal heart rate noted  Respiratory: Normal respiratory effort, no problems with respiration noted  Abdomen: Soft, gravid, appropriate for gestational age. Pain/Pressure: Present     Vaginal: Vag. Bleeding: None.    Vag D/C Character: Thin  Cervix: Exam revealed      0/0/ballotable  Extremities: Normal range of motion.  Edema: Mild pitting, slight indentation  Mental Status: Normal mood and affect. Normal behavior. Normal judgment and thought content.   Urinalysis: Urine Protein: Negative Urine Glucose: Negative  Assessment and Plan:  Pregnancy: [id number] at 4736w2d  1. History of stillbirth in currently pregnant patient, third trimester NST reactive  2. Supervision of high risk pregnancy in second trimester  3. Decrease FM NST reactive  4. Preterm contractions   Preterm labor symptoms and general obstetric precautions including but not limited to vaginal bleeding, contractions, leaking of fluid and fetal movement were reviewed in detail with the patient. Fetal kick counts  Please refer to After Visit Summary for other counseling recommendations.   Continue twice weekly testing.   Dorathy KinsmanVirginia Ting Cage, CNM

## 2014-08-19 NOTE — Patient Instructions (Signed)
Braxton Hicks Contractions °Contractions of the uterus can occur throughout pregnancy. Contractions are not always a sign that you are in labor.  °WHAT ARE BRAXTON HICKS CONTRACTIONS?  °Contractions that occur before labor are called Braxton Hicks contractions, or false labor. Toward the end of pregnancy (32-34 weeks), these contractions can develop more often and may become more forceful. This is not true labor because these contractions do not result in opening (dilatation) and thinning of the cervix. They are sometimes difficult to tell apart from true labor because these contractions can be forceful and people have different pain tolerances. You should not feel embarrassed if you go to the hospital with false labor. Sometimes, the only way to tell if you are in true labor is for your health care provider to look for changes in the cervix. °If there are no prenatal problems or other health problems associated with the pregnancy, it is completely safe to be sent home with false labor and await the onset of true labor. °HOW CAN YOU TELL THE DIFFERENCE BETWEEN TRUE AND FALSE LABOR? °False Labor °· The contractions of false labor are usually shorter and not as hard as those of true labor.   °· The contractions are usually irregular.   °· The contractions are often felt in the front of the lower abdomen and in the groin.   °· The contractions may go away when you walk around or change positions while lying down.   °· The contractions get weaker and are shorter lasting as time goes on.   °· The contractions do not usually become progressively stronger, regular, and closer together as with true labor.   °True Labor °· Contractions in true labor last 30-70 seconds, become very regular, usually become more intense, and increase in frequency.   °· The contractions do not go away with walking.   °· The discomfort is usually felt in the top of the uterus and spreads to the lower abdomen and low back.   °· True labor can be  determined by your health care provider with an exam. This will show that the cervix is dilating and getting thinner.   °WHAT TO REMEMBER °· Keep up with your usual exercises and follow other instructions given by your health care provider.   °· Take medicines as directed by your health care provider.   °· Keep your regular prenatal appointments.   °· Eat and drink lightly if you think you are going into labor.   °· If Braxton Hicks contractions are making you uncomfortable:   °¨ Change your position from lying down or resting to walking, or from walking to resting.   °¨ Sit and rest in a tub of warm water.   °¨ Drink 2-3 glasses of water. Dehydration may cause these contractions.   °¨ Do slow and deep breathing several times an hour.   °WHEN SHOULD I SEEK IMMEDIATE MEDICAL CARE? °Seek immediate medical care if: °· Your contractions become stronger, more regular, and closer together.   °· You have fluid leaking or gushing from your vagina.   °· You have a fever.   °· You pass blood-tinged mucus.   °· You have vaginal bleeding.   °· You have continuous abdominal pain.   °· You have low back pain that you never had before.   °· You feel your baby's head pushing down and causing pelvic pressure.   °· Your baby is not moving as much as it used to.   °Document Released: 02/08/2005 Document Revised: 02/13/2013 Document Reviewed: 11/20/2012 °[employer]® Patient Information ©2015 [employer]. This information is not intended to replace advice given to you by your health care   provider. Make sure you discuss any questions you have with your health care provider. ° °

## 2014-08-22 ENCOUNTER — Ambulatory Visit (INDEPENDENT_AMBULATORY_CARE_PROVIDER_SITE_OTHER): Payer: Commercial Managed Care - HMO | Admitting: *Deleted

## 2014-08-22 VITALS — BP 105/58 | HR 94 | Wt 177.0 lb

## 2014-08-22 DIAGNOSIS — O09293 Supervision of pregnancy with other poor reproductive or obstetric history, third trimester: Secondary | ICD-10-CM

## 2014-08-28 ENCOUNTER — Ambulatory Visit (INDEPENDENT_AMBULATORY_CARE_PROVIDER_SITE_OTHER): Payer: Commercial Managed Care - HMO | Admitting: Obstetrics & Gynecology

## 2014-08-28 ENCOUNTER — Other Ambulatory Visit: Payer: Self-pay | Admitting: Obstetrics & Gynecology

## 2014-08-28 ENCOUNTER — Encounter: Payer: Self-pay | Admitting: Obstetrics & Gynecology

## 2014-08-28 VITALS — BP 120/74 | HR 89 | Wt 176.0 lb

## 2014-08-28 DIAGNOSIS — Z36 Encounter for antenatal screening of mother: Secondary | ICD-10-CM

## 2014-08-28 DIAGNOSIS — Z3483 Encounter for supervision of other normal pregnancy, third trimester: Secondary | ICD-10-CM

## 2014-08-28 DIAGNOSIS — O09293 Supervision of pregnancy with other poor reproductive or obstetric history, third trimester: Secondary | ICD-10-CM

## 2014-08-28 DIAGNOSIS — O0992 Supervision of high risk pregnancy, unspecified, second trimester: Secondary | ICD-10-CM

## 2014-08-28 DIAGNOSIS — O0993 Supervision of high risk pregnancy, unspecified, third trimester: Secondary | ICD-10-CM

## 2014-08-28 LAB — OB RESULTS CONSOLE GC/CHLAMYDIA
Chlamydia: NEGATIVE
Gonorrhea: NEGATIVE

## 2014-08-28 LAB — OB RESULTS CONSOLE GBS: GBS: NEGATIVE

## 2014-08-28 NOTE — Progress Notes (Signed)
Subjective:  Archie Endorkeesha M Gusler is a 34 y.o. [phone] at 5345w4d being seen today for ongoing prenatal care.  Patient reports no complaints.  Contractions: Irregular.  Vag. Bleeding: None. Movement: Present. Denies leaking of fluid.   The following portions of the patient's history were reviewed and updated as appropriate: allergies, current medications, past family history, past medical history, past social history, past surgical history and problem list.   Objective:   Filed Vitals:   08/28/14 1011  BP: 120/74  Pulse: 89  Weight: 176 lb (79.833 kg)    Fetal Status: Fetal Heart Rate (bpm): RNST Fundal Height: 37 cm Movement: Present  Presentation: Vertex  General:  Alert, oriented and cooperative. Patient is in no acute distress.  Skin: Skin is warm and dry. No rash noted.   Cardiovascular: Normal heart rate noted  Respiratory: Normal respiratory effort, no problems with respiration noted  Abdomen: Soft, gravid, appropriate for gestational age. Pain/Pressure: Present     Vaginal: Vag. Bleeding: None.    Vag D/C Character: Thin  Cervix: Exam revealed Dilation: 1 Effacement (%): Thick Station: Ballotable  Extremities: Normal range of motion.  Edema: Trace  Mental Status: Normal mood and affect. Normal behavior. Normal judgment and thought content.   Urinalysis: Urine Protein: Trace Urine Glucose: Negative  Assessment and Plan:  Pregnancy: [id number] at 5045w4d  1. Previous stillbirth or demise, antepartum, third trimester 2. Supervision of high risk pregnancy in second trimester NST performed today was reviewed and was found to be reactive.  AFI normal at 17.7 cm.  Continue recommended antenatal testing and prenatal care. - Culture, Grp B Strep w/Rflx Suscept - GC/Chlamydia Probe Amp Preterm labor symptoms and general obstetric precautions including but not limited to vaginal bleeding, contractions, leaking of fluid and fetal movement were reviewed in detail with the patient. Please  refer to After Visit Summary for other counseling recommendations.  Return for twice a week testing, weekly visits as scheduled.   Tereso NewcomerUgonna A Jermaine Tholl, MD '

## 2014-08-28 NOTE — Patient Instructions (Signed)
Return to clinic for any obstetric concerns or go to [facility] for evaluation  

## 2014-08-29 ENCOUNTER — Ambulatory Visit (HOSPITAL_COMMUNITY)
Admission: RE | Admit: 2014-08-29 | Discharge: 2014-08-29 | Disposition: A | Discharge status: Home / Self Care | Payer: Commercial Managed Care - HMO | Source: Ambulatory Visit | Attending: Maternal and Fetal Medicine | Admitting: Maternal and Fetal Medicine

## 2014-08-29 ENCOUNTER — Ambulatory Visit (HOSPITAL_COMMUNITY)
Admission: RE | Admit: 2014-08-29 | Discharge: 2014-08-29 | Disposition: A | Discharge status: Home / Self Care | Payer: Commercial Managed Care - HMO | Source: Ambulatory Visit | Attending: Obstetrics & Gynecology | Admitting: Obstetrics & Gynecology

## 2014-08-29 DIAGNOSIS — O09293 Supervision of pregnancy with other poor reproductive or obstetric history, third trimester: Secondary | ICD-10-CM

## 2014-08-29 DIAGNOSIS — Z3A35 35 weeks gestation of pregnancy: Secondary | ICD-10-CM | POA: Insufficient documentation

## 2014-08-29 DIAGNOSIS — Z36 Encounter for antenatal screening of mother: Secondary | ICD-10-CM | POA: Insufficient documentation

## 2014-08-29 DIAGNOSIS — Z3A36 36 weeks gestation of pregnancy: Secondary | ICD-10-CM | POA: Diagnosis not present

## 2014-08-29 DIAGNOSIS — O09292 Supervision of pregnancy with other poor reproductive or obstetric history, second trimester: Secondary | ICD-10-CM

## 2014-08-29 DIAGNOSIS — O09211 Supervision of pregnancy with history of pre-term labor, first trimester: Secondary | ICD-10-CM

## 2014-08-29 DIAGNOSIS — O09213 Supervision of pregnancy with history of pre-term labor, third trimester: Secondary | ICD-10-CM | POA: Insufficient documentation

## 2014-08-30 LAB — GC/CHLAMYDIA PROBE AMP
CT Probe RNA: NEGATIVE
GC Probe RNA: NEGATIVE

## 2014-08-30 LAB — CULTURE, STREPTOCOCCUS GRP B W/SUSCEPT

## 2014-09-02 ENCOUNTER — Ambulatory Visit (INDEPENDENT_AMBULATORY_CARE_PROVIDER_SITE_OTHER): Payer: Commercial Managed Care - HMO | Admitting: Advanced Practice Midwife

## 2014-09-02 ENCOUNTER — Encounter: Payer: Self-pay | Admitting: Advanced Practice Midwife

## 2014-09-02 VITALS — BP 107/63 | HR 75 | Wt 177.0 lb

## 2014-09-02 DIAGNOSIS — O09293 Supervision of pregnancy with other poor reproductive or obstetric history, third trimester: Secondary | ICD-10-CM | POA: Diagnosis not present

## 2014-09-02 DIAGNOSIS — O0992 Supervision of high risk pregnancy, unspecified, second trimester: Secondary | ICD-10-CM

## 2014-09-02 NOTE — Progress Notes (Signed)
Increased UC's, but still very irreg. Dilated 1.5 last week. 3 cm today, but long. Go to [facility] for closer. More regular contractions. AFI subjectively normal and vtx per informal BS US today. F/U 7/14 for NST, AFI.

## 2014-09-02 NOTE — Patient Instructions (Signed)
Breastfeeding Challenges and Solutions Even though breastfeeding is natural, it can be challenging, especially in the first few weeks after childbirth. It is normal for problems to arise when starting to breastfeed your new baby, even if you have breastfed before. This document provides some solutions to the most common breastfeeding challenges.  CHALLENGES AND SOLUTIONS Challenge--Cracked or Sore Nipples Cracked or sore nipples are commonly experienced by breastfeeding mothers. Cracked or sore nipples often are caused by inadequate latching (when your baby's mouth attaches to your breast to breastfeed). Soreness can also happen if your baby is not positioned properly at your breast. Although nipple cracking and soreness are common during the first week after birth, nipple pain is never normal. If you experience nipple cracking or soreness that lasts longer than 1 week or nipple pain, call your health care provider or lactation consultant.  Solution Ensure proper latching and positioning of your baby by following the steps below:  Find a comfortable place to sit or lie down, with your neck and back well supported.  Place a pillow or rolled up blanket under your baby to bring him or her to the level of your breast (if you are seated).  Make sure that your baby's abdomen is facing your abdomen.  Gently massage your breast. With your fingertips, massage from your chest wall toward your nipple in a circular motion. This encourages milk flow. You may need to continue this action during the feeding if your milk flows slowly.  Support your breast with 4 fingers underneath and your thumb above your nipple. Make sure your fingers are well away from your nipple and your baby's mouth.  Stroke your baby's lips gently with your finger or nipple.  When your baby's mouth is open wide enough, quickly bring your baby to your breast, placing your entire nipple and as much of the colored area around your nipple  (areola) as possible into your baby's mouth.  More areola should be visible above your baby's upper lip than below the lower lip.  Your baby's tongue should be between his or her lower gum and your breast.  Ensure that your baby's mouth is correctly positioned around your nipple (latched). Your baby's lips should create a seal on your breast and be turned out (everted).  It is common for your baby to suck for about 2-3 minutes in order to start the flow of breast milk. Signs that your baby has successfully latched on to your nipple include:   Quietly tugging or quietly sucking without causing you pain.   Swallowing heard between every 3-4 sucks.   Muscle movement above and in front of his or her ears with sucking.  Signs that your baby has not successfully latched on to nipple include:   Sucking sounds or smacking sounds from your baby while nursing.   Nipple pain.  Ensure that your breasts stay moisturized and healthy by:  Avoiding the use of soap on your nipples.   Wearing a supportive bra. Avoid wearing underwire-style bras or tight bras.   Air drying your nipples for 3-4 minutes after each feeding.   Using only cotton bra pads to absorb breast milk leakage. Leaking of breast milk between feedings is normal. Be sure to change the pads if they become soaked with milk.  Using lanolin on your nipples after nursing. Lanolin helps to maintain your skin's normal moisture barrier. If you use pure lanolin you do not need to wash it off before feeding your baby again. Pure lanolin   is not toxic to your baby. You may also hand express a few drops of breast milk and gently massage that milk into your nipples, allowing it to air dry. Challenge--Breast Engorgement Breast engorgement is the overfilling of your breasts with breast milk. In the first few weeks after giving birth, you may experience breast engorgement. Breast engorgement can make your breasts throb and feel hard, tightly  stretched, warm, and tender. Engorgement peaks about the fifth day after you give birth. Having breast engorgement does not mean you have to stop breastfeeding your baby. Solution  Breastfeed when you feel the need to reduce the fullness of your breasts or when your baby shows signs of hunger. This is called "breastfeeding on demand."  Newborns (babies younger than 4 weeks) often breastfeed every 1-3 hours during the day. You may need to awaken your baby to feed if he or she is asleep at a feeding time.  Do not allow your baby to sleep longer than 5 hours during the night without a feeding.  Pump or hand express breast milk before breastfeeding to soften your breast, areola, and nipple.  Apply warm, moist heat (in the shower or with warm water-soaked hand towels) just before feeding or pumping, or massage your breast before or during breastfeeding. This increases circulation and helps your milk to flow.  Completely empty your breasts when breastfeeding or pumping. Afterward, wear a snug bra (nursing or regular) or tank top for 1-2 days to signal your body to slightly decrease milk production. Only wear snug bras or tank tops to treat engorgement. Tight bras typically should be avoided by breastfeeding mothers. Once engorgement is relieved, return to wearing regular, loose-fitting clothes.  Apply ice packs to your breasts to lessen the pain from engorgement and relieve swelling, unless the ice is uncomfortable for you.  Do not delay feedings. Try to relax when it is time to feed your baby. This helps to trigger your "let-down reflex," which releases milk from your breast.  Ensure your baby is latched on to your breast and positioned properly while breastfeeding.  Allow your baby to remain at your breast as long as he or she is latched on well and actively sucking. Your baby will let you know when he or she is done breastfeeding by pulling away from your breast or falling asleep.  Avoid  introducing bottles or pacifiers to your baby in the early weeks of breastfeeding. Wait to introduce these things until after resolving any breastfeeding challenges.  Try to pump your milk on the same schedule as when your baby would breastfeed if you are returning to work or away from home for an extended period.  Drink plenty of fluids to avoid dehydration, which can eventually put you at greater risk of breast engorgement. If you follow these suggestions, your engorgement should improve in 24-48 hours. If you are still experiencing difficulty, call your lactation consultant or health care provider.  Challenge--Plugged Milk Ducts Plugged milk ducts occur when the duct does not drain milk effectively and becomes swollen. Wearing a tight-fitting nursing bra or having difficulty with latching may cause plugged milk ducts. Not drinking enough water (8-10 c [1.9-2.4 L] per day) can contribute to plugged milk ducts. Once a duct has become plugged, hard lumps, soreness, and redness may develop in your breast.  Solution Do not delay feedings. Feed your baby frequently and try to empty your breasts of milk at each feeding. Try breastfeeding from the affected side first so there is a   better chance that the milk will drain completely from that breast. Apply warm, moist towels to your breasts for 5-10 minutes before feeding. Alternatively, a hot shower right before breastfeeding can provide the moist heat that can encourage milk flow. Gentle massage of the sore area before and during a feeding may also help. Avoid wearing tight clothing or bras that put pressure on your breasts. Wear bras that offer good support to your breasts, but avoid underwire bras. If you have a plugged milk duct and develop a fever, you need to see your health care provider.  Challenge--Mastitis Mastitis is inflammation of your breast. It usually is caused by a bacterial infection and can cause flu-like symptoms. You may develop redness in  your breast and a fever. Often when mastitis occurs, your breast becomes firm, warm, and very painful. The most common causes of mastitis are poor latching, ineffective sucking from your baby, consistent pressure on your breast (possibly from wearing a tight-fitting bra or shirt that restricts the milk flow), unusual stress or fatigue, or missed feedings.  Solution You will be given antibiotic medicine to treat the infection. It is still important to breastfeed frequently to empty your breasts. Continuing to breastfeed while you recover from mastitis will not harm your baby. Make sure your baby is positioned properly during every feeding. Apply moist heat to your breasts for a few minutes before feeding to help the milk flow and to help your breasts empty more easily. Challenge--Thrush Thrush is a yeast infection that can form on your nipples, in your breast, or in your baby's mouth. It causes itching, soreness, burning or stabbing pain, and sometimes a rash.  Solution You will be given a medicated ointment for your nipples, and your baby will be given a liquid medicine for his or her mouth. It is important that you and your baby are treated at the same time because thrush can be passed between you and your baby. Change disposable nursing pads often. Any bras, towels, or clothing that come in contact with infected areas of your body or your baby's body need to be washed in very hot water every day. Wash your hands and your baby's hands often. All pacifiers, bottle nipples, or toys your baby puts in his or her mouth should be boiled once a day for 20 minutes. After 1 week of treatment, discard pacifiers and bottle nipples and buy new ones. All breast pump parts that touch the milk need to be boiled for 20 minutes every day. Challenge--Low Milk Supply You may not be producing enough milk if your baby is not gaining the proper amount of weight. Breast milk production is based on a supply-and-demand system. Your  milk supply depends on how frequently and effectively your baby empties your breast. Solution The more you breastfeed and pump, the more breast milk you will produce. It is important that your baby empties at least one of your breasts at each feeding. If this is not happening, then use a breast pump or hand express any milk that remains. This will help to drain as much milk as possible at each feeding. It will also signal your body to produce more milk. If your baby is not emptying your breasts, it may be due to latching, sucking, or positioning problems. If low milk supply continues after addressing these issues, contact your health care provider or a lactation specialist as soon as possible. Challenge--Inverted or Flat Nipples Some women have nipples that turn inward instead of protruding outward.   Other women have nipples that are flat. Inverted or flat nipples can sometimes make it more difficult for your baby to latch onto your breast. Solution You may be given a small device that pulls out inverted nipples. This device should be applied right before your baby is brought to your breast. You can also try using a breast pump for a short time before placing the baby at your breast. The pump can pull your nipple outwards to help your infant latch more easily. The baby's sucking motion will help the inverted nipple protrude as well.  If you have flat nipples, encourage your baby to latch onto your breast and feed frequently in the early days after birth. This will give your baby practice latching on correctly while your breast is still soft. When your milk supply increases, between the second and fifth day after birth and your breasts become full, your baby will have an easier time latching.  Contact a lactation consultant if you still have concerns. She or he can teach you additional techniques to address breastfeeding problems related to nipple shape and position.  FOR MORE INFORMATION [employer]: www.llli.org Document Released: 08/02/2005 Document Revised: 02/13/2013 Document Reviewed: 08/04/2012 [employer] Patient Information 2015 [employer]. This information is not intended to replace advice given to you by your health care provider. Make sure you discuss any questions you have with your health care provider.  

## 2014-09-03 ENCOUNTER — Telehealth: Payer: Self-pay | Admitting: *Deleted

## 2014-09-03 NOTE — Telephone Encounter (Signed)
Induction scheduled for 09/14/14 @ 7 AM

## 2014-09-05 ENCOUNTER — Ambulatory Visit: Payer: Commercial Managed Care - HMO | Admitting: *Deleted

## 2014-09-05 VITALS — BP 111/58 | HR 83 | Wt 172.0 lb

## 2014-09-05 DIAGNOSIS — O09293 Supervision of pregnancy with other poor reproductive or obstetric history, third trimester: Secondary | ICD-10-CM

## 2014-09-05 NOTE — Progress Notes (Signed)
Pt here for NST and AFI. States she has had increased contractions, pain and pressure.

## 2014-09-09 ENCOUNTER — Telehealth (HOSPITAL_COMMUNITY): Payer: Self-pay | Admitting: *Deleted

## 2014-09-09 ENCOUNTER — Encounter: Payer: Self-pay | Admitting: Advanced Practice Midwife

## 2014-09-09 ENCOUNTER — Ambulatory Visit (INDEPENDENT_AMBULATORY_CARE_PROVIDER_SITE_OTHER): Payer: Commercial Managed Care - HMO | Admitting: Advanced Practice Midwife

## 2014-09-09 ENCOUNTER — Encounter (HOSPITAL_COMMUNITY): Payer: Self-pay | Admitting: *Deleted

## 2014-09-09 VITALS — BP 107/69 | HR 94 | Wt 179.0 lb

## 2014-09-09 DIAGNOSIS — O09293 Supervision of pregnancy with other poor reproductive or obstetric history, third trimester: Secondary | ICD-10-CM | POA: Diagnosis not present

## 2014-09-09 NOTE — Progress Notes (Signed)
Subjective:  Archie [employer]or[employer]ha [employer] is a 34 y.o. [phone] at 416w2d being seen today for ongoing prenatal care.  Patient reports backache and occasional contractions.  Contractions: Irregular.  Vag. Bleeding: None. Movement: Present. Denies leaking of fluid.   Scheduled for IOL this week  The following portions of the patient's history were reviewed and updated as appropriate: allergies, current medications, past family history, past medical history, past social history, past surgical history and problem list.   Objective:   Filed Vitals:   09/09/14 1103  BP: 107/69  Pulse: 94  Weight: 179 lb (81.194 kg)    Fetal Status: Fetal Heart Rate (bpm): 136 Fundal Height: 38 cm Movement: Present  Presentation: Vertex  General:  Alert, oriented and cooperative. Patient is in no acute distress.  Skin: Skin is warm and dry. No rash noted.   Cardiovascular: Normal heart rate noted  Respiratory: Normal respiratory effort, no problems with respiration noted  Abdomen: Soft, gravid, appropriate for gestational age. Pain/Pressure: Present     Vaginal: Vag. Bleeding: None.    Vag D/C Character: Mucous  Cervix: Exam revealed Dilation: 2 Effacement (%): 60 Station: -3  Extremities: Normal range of motion.  Edema: Mild pitting, slight indentation  Mental Status: Normal mood and affect. Normal behavior. Normal judgment and thought content.   NST reactive with intermittent mild contractions  Urinalysis: Urine Protein: Trace Urine Glucose: Negative  Assessment and Plan:  Pregnancy: [id number] at 2216w2d  There are no diagnoses linked to this encounter. Term labor symptoms and general obstetric precautions including but not limited to vaginal bleeding, contractions, leaking of fluid and fetal movement were reviewed in detail with the patient. Please refer to After Visit Summary for other counseling recommendations.  Return in about 6 weeks (around 10/21/2014) [place] [facility] [place]. Has NST  ordered later this week.    Aviva SignsMarie L Yzabella Crunk, CNM

## 2014-09-09 NOTE — Patient Instructions (Signed)

## 2014-09-09 NOTE — Telephone Encounter (Signed)
Preadmission screen  

## 2014-09-12 ENCOUNTER — Ambulatory Visit (INDEPENDENT_AMBULATORY_CARE_PROVIDER_SITE_OTHER): Payer: Commercial Managed Care - HMO | Admitting: *Deleted

## 2014-09-12 VITALS — BP 112/67 | HR 92 | Wt 179.0 lb

## 2014-09-12 DIAGNOSIS — O09293 Supervision of pregnancy with other poor reproductive or obstetric history, third trimester: Secondary | ICD-10-CM

## 2014-09-12 NOTE — Progress Notes (Signed)
NST today                                       Induction schedule for Sat.

## 2014-09-14 ENCOUNTER — Inpatient Hospital Stay (HOSPITAL_COMMUNITY)
Admission: AD | Admit: 2014-09-14 | Payer: Commercial Managed Care - HMO | Source: Ambulatory Visit | Admitting: Family Medicine

## 2014-09-14 ENCOUNTER — Encounter (HOSPITAL_COMMUNITY): Payer: Self-pay | Admitting: Advanced Practice Midwife

## 2014-09-14 ENCOUNTER — Inpatient Hospital Stay (HOSPITAL_COMMUNITY)
Admission: RE | Admit: 2014-09-14 | Discharge: 2014-09-17 | DRG: 775 | Disposition: A | Discharge status: Home / Self Care | Payer: Commercial Managed Care - HMO | Source: Ambulatory Visit | Attending: Obstetrics & Gynecology | Admitting: Obstetrics & Gynecology

## 2014-09-14 ENCOUNTER — Encounter (HOSPITAL_COMMUNITY): Payer: Self-pay

## 2014-09-14 VITALS — BP 107/63 | HR 75 | Temp 98.0°F | Resp 18 | Ht 62.0 in | Wt 180.0 lb

## 2014-09-14 DIAGNOSIS — O9952 Diseases of the respiratory system complicating childbirth: Secondary | ICD-10-CM | POA: Diagnosis present

## 2014-09-14 DIAGNOSIS — Z87891 Personal history of nicotine dependence: Secondary | ICD-10-CM

## 2014-09-14 DIAGNOSIS — Z3A39 39 weeks gestation of pregnancy: Secondary | ICD-10-CM | POA: Diagnosis present

## 2014-09-14 DIAGNOSIS — Z3490 Encounter for supervision of normal pregnancy, unspecified, unspecified trimester: Secondary | ICD-10-CM

## 2014-09-14 DIAGNOSIS — Z8759 Personal history of other complications of pregnancy, childbirth and the puerperium: Secondary | ICD-10-CM | POA: Diagnosis not present

## 2014-09-14 DIAGNOSIS — J45909 Unspecified asthma, uncomplicated: Secondary | ICD-10-CM | POA: Diagnosis present

## 2014-09-14 DIAGNOSIS — O9962 Diseases of the digestive system complicating childbirth: Principal | ICD-10-CM | POA: Diagnosis present

## 2014-09-14 DIAGNOSIS — O9989 Other specified diseases and conditions complicating pregnancy, childbirth and the puerperium: Secondary | ICD-10-CM | POA: Diagnosis present

## 2014-09-14 DIAGNOSIS — K219 Gastro-esophageal reflux disease without esophagitis: Secondary | ICD-10-CM | POA: Diagnosis present

## 2014-09-14 DIAGNOSIS — O09293 Supervision of pregnancy with other poor reproductive or obstetric history, third trimester: Secondary | ICD-10-CM

## 2014-09-14 DIAGNOSIS — O0992 Supervision of high risk pregnancy, unspecified, second trimester: Secondary | ICD-10-CM

## 2014-09-14 LAB — CBC
HCT: 37.5 % (ref 36.0–46.0)
Hemoglobin: 12.7 g/dL (ref 12.0–15.0)
MCH: 31.9 pg (ref 26.0–34.0)
MCHC: 33.9 g/dL (ref 30.0–36.0)
MCV: 94.2 fL (ref 78.0–100.0)
Platelets: 242 10*3/uL (ref 150–400)
RBC: 3.98 MIL/uL (ref 3.87–5.11)
RDW: 13.9 % (ref 11.5–15.5)
WBC: 9.5 10*3/uL (ref 4.0–10.5)

## 2014-09-14 LAB — TYPE AND SCREEN
ABO/RH(D): O POS
ANTIBODY SCREEN: NEGATIVE

## 2014-09-14 MED ORDER — FLEET ENEMA 7-19 GM/118ML RE ENEM: 1.0000 | ENEMA | RECTAL | Status: DC | PRN

## 2014-09-14 MED ORDER — TERBUTALINE SULFATE 1 MG/ML IJ SOLN: 0.2500 mg | Freq: Once | INTRAMUSCULAR | Status: AC | PRN

## 2014-09-14 MED ORDER — OXYTOCIN 40 UNITS IN LACTATED RINGERS INFUSION - SIMPLE MED: 62.5000 mL/h | INTRAVENOUS | Status: DC

## 2014-09-14 MED ORDER — LACTATED RINGERS IV SOLN: 500.0000 mL | INTRAVENOUS | Status: DC | PRN

## 2014-09-14 MED ORDER — LIDOCAINE HCL (PF) 1 % IJ SOLN
30.0000 mL | INTRAMUSCULAR | Status: DC | PRN
  Filled 2014-09-14: qty 30

## 2014-09-14 MED ORDER — LACTATED RINGERS IV SOLN
INTRAVENOUS | Status: DC
  Administered 2014-09-14: 23:00:00 via INTRAVENOUS
  Administered 2014-09-14 (×2): 1000 mL via INTRAVENOUS

## 2014-09-14 MED ORDER — OXYCODONE-ACETAMINOPHEN 5-325 MG PO TABS: 1.0000 | ORAL_TABLET | ORAL | Status: DC | PRN

## 2014-09-14 MED ORDER — OXYTOCIN 40 UNITS IN LACTATED RINGERS INFUSION - SIMPLE MED
1.0000 m[IU]/min | INTRAVENOUS | Status: DC
  Administered 2014-09-14: 8 m[IU]/min via INTRAVENOUS
  Administered 2014-09-14: 4 m[IU]/min via INTRAVENOUS
  Administered 2014-09-14: 12 m[IU]/min via INTRAVENOUS
  Administered 2014-09-14: 8 m[IU]/min via INTRAVENOUS
  Administered 2014-09-14: 6 m[IU]/min via INTRAVENOUS

## 2014-09-14 MED ORDER — ONDANSETRON HCL 4 MG/2ML IJ SOLN
4.0000 mg | Freq: Four times a day (QID) | INTRAMUSCULAR | Status: DC | PRN
  Filled 2014-09-14: qty 2

## 2014-09-14 MED ORDER — CITRIC ACID-SODIUM CITRATE 334-500 MG/5ML PO SOLN
30.0000 mL | ORAL | Status: DC | PRN
  Administered 2014-09-15: 30 mL via ORAL
  Filled 2014-09-14: qty 15

## 2014-09-14 MED ORDER — ACETAMINOPHEN 325 MG PO TABS
650.0000 mg | ORAL_TABLET | ORAL | Status: DC | PRN
  Administered 2014-09-15: 650 mg via ORAL
  Filled 2014-09-14: qty 2

## 2014-09-14 MED ORDER — FENTANYL CITRATE (PF) 100 MCG/2ML IJ SOLN
100.0000 ug | INTRAMUSCULAR | Status: DC | PRN
  Administered 2014-09-14 – 2014-09-15 (×2): 100 ug via INTRAVENOUS
  Filled 2014-09-14 (×2): qty 2

## 2014-09-14 MED ORDER — OXYTOCIN 40 UNITS IN LACTATED RINGERS INFUSION - SIMPLE MED
INTRAVENOUS | Status: AC
  Filled 2014-09-14: qty 1000

## 2014-09-14 MED ORDER — OXYTOCIN BOLUS FROM INFUSION
500.0000 mL | INTRAVENOUS | Status: DC
  Administered 2014-09-15: 500 mL via INTRAVENOUS

## 2014-09-14 MED ORDER — OXYCODONE-ACETAMINOPHEN 5-325 MG PO TABS: 2.0000 | ORAL_TABLET | ORAL | Status: DC | PRN

## 2014-09-14 NOTE — Progress Notes (Signed)
Eating soup.

## 2014-09-14 NOTE — Progress Notes (Signed)
Emma Walker is a 34 y.o. [id number] at [redacted]w[redacted]d by ultrasound admitted for induction of labor due to hx of IUFD.  Subjective:   Objective: BP 111/64 mmHg  Pulse 83  Temp(Src) 97.8 F (36.6 C) (Oral)  Resp 18  Ht  (1.575 m)  Wt 180 lb (81.647 kg)  BMI 32.91 kg/m2  LMP 12/15/2013 (Exact Date)      FHT:  FHR: 130 bpm, variability: moderate,  accelerations:  Abscent,  decelerations:  Absent UC:   regular, every 5 minutes and mild SVE:   Dilation: 3 Effacement (%): 70 Station: -2 Exam by:: Dherr rn  Labs: Lab Results  Component Value Date   WBC 9.5 09/14/2014   HGB 12.7 09/14/2014   HCT 37.5 09/14/2014   MCV 94.2 09/14/2014   PLT 242 09/14/2014    Assessment / Plan: Induction of labor due to hx of prev demise,  progressing well on pitocin  Labor: Progressing on Pitocin, will continue to increase then AROM Preeclampsia:  no signs or symptoms of toxicity and intake and ouput balanced Fetal Wellbeing:  Category I Pain Control:  Labor support without medications I/D:  n/a Anticipated MOD:  NSVD  LAWSON, MARIE DARLENE 09/14/2014, 5:32 PM

## 2014-09-14 NOTE — H&P (Signed)
HPI: Emma Walker is a 34 y.o. year old G35P2112 female at [redacted]w[redacted]d weeks gestation who presents to [facility] for IOL for Hx IUFD (possible cord accident).    Clinic  [facility] Prenatal Labs  Dating  LMP, consistent with 13 wk ultrasound Blood type: O/POS/-- (12/23 1324)  Genetic Screen 1 Screen:  Neg  AFP:  Neg            Quad:                  NIPS: Antibody:NEG (12/23 1324)  Anatomic [place] Normal female Rubella: 2.99 (12/23 1324)  GTT Early:               Third trimester: 96 RPR: NON REAC (12/23 1324)   TDaP vaccine  07/01/14 HBsAg: NEGATIVE (12/23 1324)   Flu vaccine 11/2013   HIV: NONREACTIVE (12/23 1324)   GBS Negative GBS: Negative  Contraception Mirena Pap:  Neg; GC/CT neg x 2  Baby Food  Breast   Circumcision NA   Pediatrician  [facility] ([facility])   Support Person FOB Adam    Maternal Medical History:  Reason for admission: Induction of labor    OB History    Gravida Para Term Preterm AB TAB SAB Ectopic Multiple Living   5 3 2 1 1  1   2      Past Medical History  Diagnosis Date  . Asthma   . GERD (gastroesophageal reflux disease)   . Arm pain, right   . Headache(784.0)     migraines  . Radiculopathy 10/04/2013   Past Surgical History  Procedure Laterality Date  . Tonsillectomy  1998  . Foot surgery  2002  . Colposcopy  2002  . Anterior cervical decomp/discectomy fusion  04/15/2011    Procedure: ANTERIOR CERVICAL DECOMPRESSION/DISCECTOMY FUSION 2 LEVELS;  Surgeon: Emilee Hero, MD;  Location: [facility] OR;  Service: Orthopedics;  Laterality: N/A;  Anterior cervical decompression fusion, cervical 4-5, cervical 5-6 with instrumentation and allograft.  . Anterior cervical decomp/discectomy fusion N/A 10/04/2013    Procedure: ANTERIOR CERVICAL DECOMPRESSION/DISCECTOMY FUSION 1 LEVEL;  Surgeon: Emilee Hero, MD;  Location: [facility] OR;  Service: Orthopedics;  Laterality: N/A;  Anterior cervical decompression fusion cervical 3-4 with instrumentation and allograft    Family History: family history includes Asthma in her brother and mother; Breast cancer in her maternal aunt and maternal aunt; Bronchiolitis in her sister; Cancer in her paternal grandmother; Lung cancer in her paternal grandfather; Pancreatic cancer in her maternal grandmother. Social History:  reports that she quit smoking about 8 months ago. Her smoking use included Cigarettes. She has a 5 pack-year smoking history. She has never used smokeless tobacco. She reports that she does not drink alcohol or use illicit drugs.   Prenatal Transfer Tool  Maternal Diabetes: No Genetic Screening: Normal Maternal Ultrasounds/Referrals: Normal Fetal Ultrasounds or other Referrals:  None Maternal Substance Abuse:  No Significant Maternal Medications:  None Significant Maternal Lab Results:  Lab values include: Group B Strep negative Other Comments:  None  Review of Systems  Constitutional: Negative for fever and chills.  Eyes: Negative for blurred vision.  Gastrointestinal: Negative for vomiting and abdominal pain.  Genitourinary:       Neg for VB  Or LOF  Neurological: Negative for headaches.      Last menstrual period 12/15/2013.  BP: 128/59 P: 80: RR: 18, Temp: 98.5 Maternal Exam:  Uterine Assessment: Contraction strength is mild.  Contraction frequency is irregular.   Abdomen:  Patient reports no abdominal tenderness. Estimated fetal weight is 6-1 on 08/29/14.   Fetal presentation: vertex  Introitus: Normal vulva. Pelvis: adequate for delivery.   Cervix: Cervix evaluated by digital exam.     Fetal Exam Fetal Monitor Review: Mode: ultrasound.   Baseline rate: 135.  Variability: moderate (6-25 bpm).   Pattern: accelerations present and no decelerations.    Fetal State Assessment: Category I - tracings are normal.     Physical Exam  Nursing note and vitals reviewed. Constitutional: She is oriented to person, place, and time. She appears well-developed and well-nourished. She  appears distressed.  HENT:  Head: Normocephalic.  Eyes: Conjunctivae are normal.  Cardiovascular: Normal rate, regular rhythm and normal heart sounds.   Respiratory: Effort normal and breath sounds normal. No respiratory distress.  GI: Soft. There is no tenderness.  Musculoskeletal: She exhibits edema (1+).  Neurological: She is alert and oriented to person, place, and time. She has normal reflexes.  Skin: Skin is warm and dry.  Psychiatric: Her mood appears anxious.    Prenatal labs: ABO, Rh: O/POS/-- (12/23 1324) Antibody: NEG (12/23 1324) Rubella: 2.99 (12/23 1324) RPR: NON REAC (05/09 1135)  HBsAg: NEGATIVE (12/23 1324)  HIV: NONREACTIVE (05/09 1135)  GBS: Negative (07/06 0000)   Assessment: 1. Labor: IOL 2. Fetal Wellbeing: Category I  3. Pain Control: None 4. GBS: Neg 5. 39.0 week IUP 6. Hx IUFD  Plan:  1. Admit to BS per consult with MD 2. Routine L&D orders 3. Analgesia/anesthesia PRN  4. Pitocin  Evelette Hollern 09/14/2014, 2:44 AM

## 2014-09-14 NOTE — Progress Notes (Signed)
Patient ID: Emma Walker, female   DOB: Oct 17, 1980, 34 y.o.   MRN: [id number]  Emma Walker is a 34 y.o. [id number] at [redacted]w[redacted]d by LMP admitted for induction of labor due to Gestational diabetes.  Subjective: Pt reports feeling ctx. Otherwise no complaints.   Objective: BP 116/70 mmHg  Pulse 75  Temp(Src) 97.8 F (36.6 C) (Oral)  Resp 18  Ht  (1.575 m)  Wt 81.647 kg (180 lb)  BMI 32.91 kg/m2  LMP 12/15/2013 (Exact Date)      FHT:  FHR: 130 bpm, variability: minimal-moderate,  accelerations:  Present,  decelerations:  Absent UC:   irregular, every 3 minutes SVE:   Dilation: 5.5 Effacement (%): 80 Station: -2 Exam by:: Dherr rn  Labs: Lab Results  Component Value Date   WBC 9.5 09/14/2014   HGB 12.7 09/14/2014   HCT 37.5 09/14/2014   MCV 94.2 09/14/2014   PLT 242 09/14/2014    Assessment / Plan: Induction of labor due to gestational diabetes,  progressing well on pitocin  Labor: Progressing on Pitocin, will continue to increase then AROM Preeclampsia:   Fetal Wellbeing:  Category I Pain Control:  Labor support without medications Anticipated MOD:  NSVD  Parks Ranger 09/14/2014, 3:10 PM

## 2014-09-14 NOTE — Progress Notes (Signed)
Emma Walker is a 34 y.o. [id number] at [redacted]w[redacted]d by ultrasound admitted for induction of labor due to hx of IUFD.  Subjective:   Objective: BP 125/69 mmHg  Pulse 88  Temp(Src) 98.5 F (36.9 C) (Oral)  Resp 18  Ht  (1.575 m)  Wt 180 lb (81.647 kg)  BMI 32.91 kg/m2  LMP 12/15/2013 (Exact Date)      FHT:  FHR: 125 bpm, variability: moderate,  accelerations:  Present,  decelerations:  Absent UC:   regular, every 5 minutes SVE:   Dilation: 2.5 Effacement (%): 70 Station: -2 Exam by:: DHerr rn  Labs: Lab Results  Component Value Date   WBC 9.5 09/14/2014   HGB 12.7 09/14/2014   HCT 37.5 09/14/2014   MCV 94.2 09/14/2014   PLT 242 09/14/2014    Assessment / Plan: Induction of labor due to prev IUFD,  progressing well on pitocin  Labor: Progressing normally Preeclampsia:  no signs or symptoms of toxicity and intake and ouput balanced Fetal Wellbeing:  Category I Pain Control:  Labor support without medications I/D:  n/a Anticipated MOD:  NSVD  LAWSON, MARIE DARLENE 09/14/2014, 11:34 AM

## 2014-09-15 ENCOUNTER — Inpatient Hospital Stay (HOSPITAL_COMMUNITY): Payer: Commercial Managed Care - HMO | Admitting: Anesthesiology

## 2014-09-15 ENCOUNTER — Encounter (HOSPITAL_COMMUNITY): Payer: Self-pay

## 2014-09-15 DIAGNOSIS — Z3A39 39 weeks gestation of pregnancy: Secondary | ICD-10-CM

## 2014-09-15 DIAGNOSIS — Z8759 Personal history of other complications of pregnancy, childbirth and the puerperium: Secondary | ICD-10-CM

## 2014-09-15 LAB — RPR: RPR Ser Ql: NONREACTIVE

## 2014-09-15 MED ORDER — MEASLES, MUMPS & RUBELLA VAC ~~LOC~~ INJ
0.5000 mL | INJECTION | Freq: Once | SUBCUTANEOUS | Status: DC
  Filled 2014-09-15: qty 0.5

## 2014-09-15 MED ORDER — PRENATAL MULTIVITAMIN CH
1.0000 | ORAL_TABLET | Freq: Every day | ORAL | Status: DC
  Administered 2014-09-15 – 2014-09-17 (×3): 1 via ORAL
  Filled 2014-09-15 (×3): qty 1

## 2014-09-15 MED ORDER — FENTANYL 2.5 MCG/ML BUPIVACAINE 1/10 % EPIDURAL INFUSION (WH - ANES)
INTRAMUSCULAR | Status: AC
  Administered 2014-09-15: 14 mL/h via EPIDURAL
  Filled 2014-09-15: qty 125

## 2014-09-15 MED ORDER — FENTANYL 2.5 MCG/ML BUPIVACAINE 1/10 % EPIDURAL INFUSION (WH - ANES)
14.0000 mL/h | INTRAMUSCULAR | Status: DC | PRN
  Administered 2014-09-15: 12 mL/h via EPIDURAL
  Administered 2014-09-15: 14 mL/h via EPIDURAL

## 2014-09-15 MED ORDER — SENNOSIDES-DOCUSATE SODIUM 8.6-50 MG PO TABS
2.0000 | ORAL_TABLET | ORAL | Status: DC
  Administered 2014-09-15 – 2014-09-17 (×2): 2 via ORAL
  Filled 2014-09-15 (×2): qty 2

## 2014-09-15 MED ORDER — LIDOCAINE-EPINEPHRINE (PF) 2 %-1:200000 IJ SOLN
INTRAMUSCULAR | Status: DC | PRN
  Administered 2014-09-15: 4 mL

## 2014-09-15 MED ORDER — BUPIVACAINE HCL (PF) 0.25 % IJ SOLN
INTRAMUSCULAR | Status: DC | PRN
  Administered 2014-09-15 (×2): 4 mL

## 2014-09-15 MED ORDER — IBUPROFEN 600 MG PO TABS
600.0000 mg | ORAL_TABLET | Freq: Four times a day (QID) | ORAL | Status: DC
  Administered 2014-09-15 – 2014-09-17 (×9): 600 mg via ORAL
  Filled 2014-09-15 (×9): qty 1

## 2014-09-15 MED ORDER — BENZOCAINE-MENTHOL 20-0.5 % EX AERO
1.0000 "application " | INHALATION_SPRAY | CUTANEOUS | Status: DC | PRN
  Administered 2014-09-15: 1 via TOPICAL
  Filled 2014-09-15: qty 56

## 2014-09-15 MED ORDER — WITCH HAZEL-GLYCERIN EX PADS: 1.0000 "application " | MEDICATED_PAD | CUTANEOUS | Status: DC | PRN

## 2014-09-15 MED ORDER — OXYCODONE-ACETAMINOPHEN 5-325 MG PO TABS
1.0000 | ORAL_TABLET | ORAL | Status: DC | PRN
  Administered 2014-09-15 – 2014-09-16 (×4): 1 via ORAL
  Filled 2014-09-15 (×4): qty 1

## 2014-09-15 MED ORDER — DIPHENHYDRAMINE HCL 50 MG/ML IJ SOLN: 12.5000 mg | INTRAMUSCULAR | Status: DC | PRN

## 2014-09-15 MED ORDER — ACETAMINOPHEN 325 MG PO TABS: 650.0000 mg | ORAL_TABLET | ORAL | Status: DC | PRN

## 2014-09-15 MED ORDER — ONDANSETRON HCL 4 MG PO TABS: 4.0000 mg | ORAL_TABLET | ORAL | Status: DC | PRN

## 2014-09-15 MED ORDER — MISOPROSTOL 200 MCG PO TABS
ORAL_TABLET | ORAL | Status: AC
  Filled 2014-09-15: qty 5

## 2014-09-15 MED ORDER — PHENYLEPHRINE 40 MCG/ML (10ML) SYRINGE FOR IV PUSH (FOR BLOOD PRESSURE SUPPORT)
PREFILLED_SYRINGE | INTRAVENOUS | Status: AC
  Filled 2014-09-15: qty 10

## 2014-09-15 MED ORDER — DIBUCAINE 1 % RE OINT
1.0000 "application " | TOPICAL_OINTMENT | RECTAL | Status: DC | PRN
  Administered 2014-09-15: 1 via RECTAL
  Filled 2014-09-15: qty 28

## 2014-09-15 MED ORDER — SIMETHICONE 80 MG PO CHEW: 80.0000 mg | CHEWABLE_TABLET | ORAL | Status: DC | PRN

## 2014-09-15 MED ORDER — LANOLIN HYDROUS EX OINT: 1.0000 "application " | TOPICAL_OINTMENT | CUTANEOUS | Status: DC | PRN

## 2014-09-15 MED ORDER — MAGNESIUM HYDROXIDE 400 MG/5ML PO SUSP: 30.0000 mL | ORAL | Status: DC | PRN

## 2014-09-15 MED ORDER — PHENYLEPHRINE 40 MCG/ML (10ML) SYRINGE FOR IV PUSH (FOR BLOOD PRESSURE SUPPORT)
80.0000 ug | PREFILLED_SYRINGE | INTRAVENOUS | Status: DC | PRN
  Filled 2014-09-15: qty 2

## 2014-09-15 MED ORDER — ZOLPIDEM TARTRATE 5 MG PO TABS: 5.0000 mg | ORAL_TABLET | Freq: Every evening | ORAL | Status: DC | PRN

## 2014-09-15 MED ORDER — OXYCODONE-ACETAMINOPHEN 5-325 MG PO TABS: 2.0000 | ORAL_TABLET | ORAL | Status: DC | PRN

## 2014-09-15 MED ORDER — TETANUS-DIPHTH-ACELL PERTUSSIS 5-2.5-18.5 LF-MCG/0.5 IM SUSP: 0.5000 mL | Freq: Once | INTRAMUSCULAR | Status: DC

## 2014-09-15 MED ORDER — FERROUS SULFATE 325 (65 FE) MG PO TABS
325.0000 mg | ORAL_TABLET | Freq: Two times a day (BID) | ORAL | Status: DC
  Administered 2014-09-15 – 2014-09-17 (×4): 325 mg via ORAL
  Filled 2014-09-15 (×4): qty 1

## 2014-09-15 MED ORDER — ONDANSETRON HCL 4 MG/2ML IJ SOLN: 4.0000 mg | INTRAMUSCULAR | Status: DC | PRN

## 2014-09-15 MED ORDER — EPHEDRINE 5 MG/ML INJ
10.0000 mg | INTRAVENOUS | Status: DC | PRN
  Filled 2014-09-15: qty 2

## 2014-09-15 MED ORDER — DIPHENHYDRAMINE HCL 25 MG PO CAPS: 25.0000 mg | ORAL_CAPSULE | Freq: Four times a day (QID) | ORAL | Status: DC | PRN

## 2014-09-15 NOTE — Anesthesia Procedure Notes (Signed)
Epidural Patient location during procedure: OB  Staffing Anesthesiologist: Bennye Nix, CHRIS Performed by: anesthesiologist   Preanesthetic Checklist Completed: patient identified, surgical consent, pre-op evaluation, timeout performed, IV checked, risks and benefits discussed and monitors and equipment checked  Epidural Patient position: sitting Prep: DuraPrep Patient monitoring: heart rate, cardiac monitor, continuous pulse ox and blood pressure Approach: midline Location: L3-L4 Injection technique: LOR saline  Needle:  Needle type: Tuohy  Needle gauge: 17 G Needle length: 9 cm Needle insertion depth: 7 cm Catheter type: closed end flexible Catheter size: 19 Gauge Catheter at skin depth: 13 cm Test dose: negative and 2% lidocaine with Epi 1:200 K  Assessment Events: blood not aspirated, injection not painful, no injection resistance, negative IV test and no paresthesia  Additional Notes Reason for block:procedure for pain   

## 2014-09-15 NOTE — Progress Notes (Addendum)
Pt still unable to bend r leg -left leg normal

## 2014-09-15 NOTE — Anesthesia Preprocedure Evaluation (Signed)
Anesthesia Evaluation  Patient identified by MRN, date of birth, ID band Patient awake    Reviewed: Allergy & Precautions, Patient's Chart, lab work & pertinent test results  History of Anesthesia Complications Negative for: history of anesthetic complications  Airway Mallampati: II  TM Distance: >3 FB Neck ROM: Full    Dental  (+) Teeth Intact   Pulmonary asthma , former smoker,  breath sounds clear to auscultation        Cardiovascular negative cardio ROS  Rhythm:Regular     Neuro/Psych  Headaches,  Neuromuscular disease negative psych ROS   GI/Hepatic Neg liver ROS, GERD-  ,  Endo/Other  negative endocrine ROS  Renal/GU negative Renal ROS     Musculoskeletal   Abdominal   Peds  Hematology negative hematology ROS (+)   Anesthesia Other Findings   Reproductive/Obstetrics (+) Pregnancy                             Anesthesia Physical Anesthesia Plan  ASA: II  Anesthesia Plan: Epidural   Post-op Pain Management:    Induction:   Airway Management Planned:   Additional Equipment:   Intra-op Plan:   Post-operative Plan:   Informed Consent: I have reviewed the patients History and Physical, chart, labs and discussed the procedure including the risks, benefits and alternatives for the proposed anesthesia with the patient or authorized representative who has indicated his/her understanding and acceptance.     Plan Discussed with: Anesthesiologist  Anesthesia Plan Comments:         Anesthesia Quick Evaluation

## 2014-09-15 NOTE — Progress Notes (Signed)
Patient ID: Emma Walker, female   DOB: 05/16/1980, 34 y.o.   MRN: [id number] Emma Walker is a 34 y.o. [id number] at [redacted]w[redacted]d.  Subjective: More uncomfortable w/ UC's  Objective:  70  --  18  109/61 mmHg    FHT:  FHR: 120 bpm, variability: mod,  accelerations:  15-x15,  decelerations:  none UC:   Q 2-3 minutes, moderate   Cervix 4/70/-2 AROM moderate amount of clear fluid.   Labs: None  Assessment / Plan: [redacted]w[redacted]d week IUP Labor: Early Fetal Wellbeing:  Category I Pain Control:  Comfort measures Anticipated MOD:  SVD  Dorathy Kinsman, CNM 09/15/2014 4:53 AM

## 2014-09-15 NOTE — Progress Notes (Signed)
Patient ID: Emma Walker, female   DOB: 05/30/1980, 34 y.o.   MRN: [id number] Emma Walker is a 34 y.o. [id number] at [redacted]w[redacted]d.  Subjective: Comfortable w/ epidural.   Objective: BP 109/66 mmHg  Pulse 80  Temp(Src) 97.5 F (36.4 C) (Oral)  Resp 18  Ht  (1.575 m)  Wt 180 lb (81.647 kg)  BMI 32.91 kg/m2  LMP 12/15/2013 (Exact Date)   FHT:  FHR: 120  bpm, variability: mod,  accelerations:  15x15,  decelerations:  None UC:   Q 2-3 minutes, mod-strong   Dilation: 6 Effacement (%): 80 Cervical Position: Middle Station: -1, 0 Presentation: Vertex Exam by:: Joni Fears Tietje RN  Labs: NA  Assessment / Plan: [redacted]w[redacted]d week IUP Labor: Active Fetal Wellbeing:  Category I Pain Control:  Epidural Anticipated MOD:  SVD  Dorathy Kinsman, CNM 09/15/2014 4:57 AM

## 2014-09-15 NOTE — Progress Notes (Signed)
Patient ID: AYDAN PHOENIX, female   DOB: 05-19-1980, 34 y.o.   MRN: [id number] PALIN TRISTAN is a 34 y.o. [id number] at [redacted]w[redacted]d.  Subjective: Moderately uncomfortable w/ UC's.  Objective: 09/14/14 2127 Temp : 98.5 --  78  --  18  115/65 mmHg   FHT:  FHR: 135 bpm, variability: mod,  accelerations:  15x15,  decelerations:  none UC:   Q 2-4 minutes, moderate  3.5/70/-2, posterior  Labs: Results for orders placed or performed during the hospital encounter of 09/14/14 (from the past 24 hour(s))  CBC     Status: None   Collection Time: 09/14/14  7:40 AM  Result Value Ref Range   WBC 9.5 4.0 - 10.5 K/uL   RBC 3.98 3.87 - 5.11 MIL/uL   Hemoglobin 12.7 12.0 - 15.0 g/dL   HCT 13.0 86.5 - 78.4 %   MCV 94.2 78.0 - 100.0 fL   MCH 31.9 26.0 - 34.0 pg   MCHC 33.9 30.0 - 36.0 g/dL   RDW 69.6 29.5 - 28.4 %   Platelets 242 150 - 400 K/uL  Type and screen     Status: None   Collection Time: 09/14/14  7:40 AM  Result Value Ref Range   ABO/RH(D) O POS    Antibody Screen NEG    Sample Expiration 09/17/2014     Assessment / Plan: [redacted]w[redacted]d week IUP Labor: Early Fetal Wellbeing:  Category I Pain Control:  Comfort measures Anticipated MOD:  SVD  Dorathy Kinsman, CNM 09/15/2014 4:50 AM

## 2014-09-16 NOTE — Lactation Note (Signed)
This note was copied from the chart of Emma Amaris Delafuente. Lactation Consultation Note Experienced BF mom BF her 1 child for 3 months. Denies and problems. States this baby BF all the time. Mom has very large nipples but appears to be BF well and doesn't have trouble latching. Hand expression done to check for colostrum. Mom has colostrum and she was happy to see that. Heard swallow and explained to mom what that was. Baby has good output. Educated about newborn behavior, cluster feeding, supply and demand. Mom encouraged to do skin-to-skin.Hand expression taught to Mom. Referred to Baby and Me Book in Breastfeeding section Pg. 22-23 for position options and Proper latch demonstration.Mom encouraged to waken baby for feeds. Mom encouraged to feed baby 8-12 times/24 hours and with feeding cues. Encouraged comfort during BF so colostrum flows better and mom will enjoy the feeding longer. Taking deep breaths and breast massage during BF. WH/LC brochure given w/resources, support groups and LC services. Patient Name: Emma Walker [id number] Date: 09/16/2014 Reason for consult: Initial assessment   Maternal Data Has patient been taught Hand Expression?: Yes Does the patient have breastfeeding experience prior to this delivery?: Yes  Feeding Feeding Type: Breast Fed Length of feed: 15 min  LATCH Score/Interventions Latch: Grasps breast easily, tongue down, lips flanged, rhythmical sucking. Intervention(s): Adjust position;Breast massage;Breast compression  Audible Swallowing: Spontaneous and intermittent Intervention(s): Skin to skin;Hand expression  Type of Nipple: Everted at rest and after stimulation  Comfort (Breast/Nipple): Soft / non-tender     Hold (Positioning): Assistance needed to correctly position infant at breast and maintain latch. Intervention(s): Breastfeeding basics reviewed;Support Pillows;Position options;Skin to skin  LATCH Score: 9  Lactation Tools  Discussed/Used     Consult Status Consult Status: Follow-up Date: 09/17/14 Follow-up type: In-patient    Janyth Riera, Diamond Nickel 09/16/2014, 5:20 AM

## 2014-09-16 NOTE — Lactation Note (Addendum)
This note was copied from the chart of Emma Walker. Lactation Consultation Note  Patient Name: Emma Walker [id number] Date: 09/16/2014 Reason for consult: Follow-up assessment   With this mom of a term baby, now 26 hours old, and asleep in boppy pillow, next to mom, while she eats lunch. Mom reports breast feeding  is going well.    Maternal Data    Feeding Feeding Type: Breast Fed Length of feed: 10 min  LATCH Score/Interventions                      Lactation Tools Discussed/Used     Consult Status Consult Status: Follow-up Date: 09/17/14 Follow-up type: In-patient    Alfred Levins 09/16/2014, 2:49 PM

## 2014-09-16 NOTE — Progress Notes (Signed)
Post Partum Day 1 Subjective: no complaints, up ad lib, voiding and tolerating PO  Objective: Blood pressure 118/65, pulse 76, temperature 97.9 F (36.6 C), temperature source Oral, resp. rate 18, height  (1.575 m), weight 180 lb (81.647 kg), last menstrual period 12/15/2013, unknown if currently breastfeeding.  Physical Exam:  General: alert, cooperative, appears stated age and no distress Lochia: appropriate Uterine Fundus: firm Incision: n/a DVT Evaluation: No evidence of DVT seen on physical exam. Negative Homan's sign. No cords or calf tenderness. No significant calf/ankle edema.   Recent Labs  09/14/14 0740  HGB 12.7  HCT 37.5    Assessment/Plan: Plan for discharge tomorrow   LOS: 2 days   Wyvonnia Dusky DARLENE 09/16/2014, 7:17 AM

## 2014-09-16 NOTE — Anesthesia Postprocedure Evaluation (Signed)
  Anesthesia Post-op Note  Patient: Emma Walker  Procedure(s) Performed: * No procedures listed *  Patient Location: Mother/Baby  Anesthesia Type:Epidural  Level of Consciousness: awake, alert , oriented and patient cooperative  Airway and Oxygen Therapy: Patient Spontanous Breathing  Post-op Pain: mild  Post-op Assessment: Patient's Cardiovascular Status Stable, Respiratory Function Stable, No headache, No backache and Patient able to bend at knees;no residual numbness;ambulating without assistance              Post-op Vital Signs: stable  Last Vitals:  Filed Vitals:   09/16/14 0605  BP: 118/65  Pulse: 76  Temp: 36.6 C  Resp: 18    Complications: No apparent anesthesia complications

## 2014-09-17 MED ORDER — OXYCODONE-ACETAMINOPHEN 5-325 MG PO TABS: 1.0000 | ORAL_TABLET | ORAL | Status: DC | PRN

## 2014-09-17 NOTE — Discharge Instructions (Signed)

## 2014-09-17 NOTE — Discharge Summary (Signed)
Obstetric Discharge Summary Reason for Admission: induction of labor Prenatal Procedures: NST Intrapartum Procedures: spontaneous vaginal delivery Postpartum Procedures: none Complications-Operative and Postpartum: none HEMOGLOBIN  Date Value Ref Range Status  09/14/2014 12.7 12.0 - 15.0 g/dL Final   HCT  Date Value Ref Range Status  09/14/2014 37.5 36.0 - 46.0 % Final  Hospital Course: HPI: Emma Walker is a 34 y.o. year old G3P2112 female at [redacted]w[redacted]d weeks gestation who presents to [facility] for IOL for Hx IUFD (possible cord accident). Delivery Note At 7:30 AM a viable and healthy female was delivered via Vaginal, Spontaneous Delivery (Presentation: ; Occiput Anterior). APGAR: 8, 8; weight 7 lb 6.5 oz (3360 g).  Placenta status: Intact, Spontaneous. Cord: 3 vessels with the following complications: None. Cord pH: NA  Anesthesia: Epidural  Episiotomy: None Lacerations: None Suture Repair: NA Est. Blood Loss (mL): 227  Mom to postpartum. Baby to Couplet care / Skin to Skin. Placenta to: BS Feeding: Breast Circ: NA Contraception: Emma Walker, [place] 09/15/2014, 9:28 AM  Has done well postpartum. Tolerating POs well and ambulating well. Breastfeeding infant.  She has some persistent left thigh pain, probably referred nerve compression. May use ibuprofen   Will give limited Rx for Percocet. Ready for discharge.  Physical Exam:  General: alert, cooperative and no distress Lochia: appropriate Uterine Fundus: firm Incision: n/a DVT Evaluation: No evidence of DVT seen on physical exam.  Discharge Diagnoses: Term Pregnancy-delivered  Discharge Information: Date: 09/17/2014 Activity: unrestricted and pelvic rest Diet: routine Medications: PNV and Ibuprofen Condition: stable and improved Instructions: refer to practice specific booklet Discharge to: home   Newborn Data: Live born female  Birth Weight: 7 lb 6.5 oz (3360 g) APGAR: 8, 8  Home with  mother.  Wynelle Bourgeois 09/17/2014, 5:00 AM

## 2014-10-29 ENCOUNTER — Ambulatory Visit (INDEPENDENT_AMBULATORY_CARE_PROVIDER_SITE_OTHER): Payer: Commercial Managed Care - HMO | Admitting: Physician Assistant

## 2014-10-29 ENCOUNTER — Encounter: Payer: Self-pay | Admitting: Physician Assistant

## 2014-10-29 VITALS — BP 123/76 | HR 65 | Resp 16 | Ht 62.0 in | Wt 161.0 lb

## 2014-10-29 DIAGNOSIS — G43009 Migraine without aura, not intractable, without status migrainosus: Secondary | ICD-10-CM | POA: Diagnosis not present

## 2014-10-29 DIAGNOSIS — M62838 Other muscle spasm: Secondary | ICD-10-CM

## 2014-10-29 MED ORDER — RIZATRIPTAN BENZOATE 10 MG PO TABS: 10.0000 mg | ORAL_TABLET | ORAL | Status: DC | PRN

## 2014-10-29 MED ORDER — NAPROXEN SODIUM 550 MG PO TABS: 550.0000 mg | ORAL_TABLET | Freq: Two times a day (BID) | ORAL | Status: DC

## 2014-10-29 NOTE — Patient Instructions (Signed)

## 2014-10-29 NOTE — Progress Notes (Signed)
Patient ID: Emma Walker, female   DOB: 06-23-1980, 34 y.o.   MRN: [id number] History:  Emma Walker is a 34 y.o. [id number] who presents to clinic today for new evaluation of headaches.  She has previously been seen at [facility] by neurologist specializing in headache.  She was diagnosed with migraines.  They were controlled prior to pregnancy.  She had neck surgery and this helped her headaches significantly.  Then she became pregnant.  Her headaches were not terrible during pregnancy.  Then, the headaches came back within a week after delivery.  She is nursing her daughter that is 61 weeks old.  She supplements with formula.  She also has 79 and 61 year old sons.   Headaches are described as moderate to severe at their worst, lasting up to all day.    It is usually located right temporal but sometimes bilateral or over the nose.  She has throbbing.   They are worse with movement.   She has sensitivity to lights and noise.  She does not have nausea or vomiting.  No vision changes noted prior to HA.   She is not using any medications to treat the HA.   She has a headache now.  She did not awaken with it but it started shortly after.    She has used many medications before but she is unable to remember.    HIT6: 52 Number of days in the last 4 weeks with:  Severe headache: 0 Moderate headache: 4 Mild headache: 4 No headache: 20  Past Medical History  Diagnosis Date  . GERD (gastroesophageal reflux disease)   . Arm pain, right   . Headache(784.0)     migraines  . Radiculopathy 10/04/2013  . Asthma     Social History   Social History  . Marital Status: Married    Spouse Name: N/A  . Number of Children: N/A  . Years of Education: N/A   Occupational History  . Not on file.   Social History Main Topics  . Smoking status: Former Smoker -- 0.50 packs/day for 10 years    Types: Cigarettes    Quit date: 01/06/2014  . Smokeless tobacco: Never Used  . Alcohol Use: No  . Drug Use:  No  . Sexual Activity: Yes    Birth Control/ Protection: None   Other Topics Concern  . Not on file   Social History Narrative    Family History  Problem Relation Age of Onset  . Asthma Mother   . Bronchiolitis Sister   . Asthma Brother   . Breast cancer Maternal Aunt   . Breast cancer Maternal Aunt   . Pancreatic cancer Maternal Grandmother   . Lung cancer Paternal Grandfather   . Cancer Paternal Grandmother     No Known Allergies  Current Outpatient Prescriptions on File Prior to Visit  Medication Sig Dispense Refill  . albuterol (PROVENTIL HFA;VENTOLIN HFA) 108 (90 BASE) MCG/ACT inhaler Inhale 2 puffs into the lungs every 6 (six) hours as needed. For wheezing    . cyclobenzaprine (FLEXERIL) 10 MG tablet Take 10 mg by mouth.    Clinical research associate Bandages & Supports (MEDICAL COMPRESSION STOCKINGS) MISC Pregnancy compression pantyhose, 15-20 compression 1 each 0  . ibuprofen (ADVIL,MOTRIN) 600 MG tablet Take 1 tablet (600 mg total) by mouth every 6 (six) hours as needed for moderate pain. Do not use after [redacted] weeks gestation. 30 tablet 1  . loratadine (CLARITIN) 10 MG tablet Take 10 mg by  mouth daily.    . Prenatal w/o A Vit-Fe Fum-FA (VIVA CT PRENATAL) 28-1 MG CHEW Chew 2 tablets by mouth daily.    Marland Kitchen triamcinolone cream (KENALOG) 0.1 % Apply 1 application topically 2 (two) times daily. 30 g 0   No current facility-administered medications on file prior to visit.     Review of Systems:  All pertinent positive/negative included in HPI, all other review of systems are negative  Objective:  Physical Exam BP 123/76 mmHg  Pulse 65  Resp 16  Ht  (1.575 m)  Wt 161 lb (73.029 kg)  BMI 29.44 kg/m2  Breastfeeding? Yes CONSTITUTIONAL: Well-developed, well-nourished female in no acute distress.  EYES: EOM intact ENT: Normocephalic CARDIOVASCULAR: Regular rate and rhythm with no adventitious sounds.  RESPIRATORY: Normal rate. Clear to auscultation bilaterally.  ENDOCRINE: Normal  thyroid.  MUSCULOSKELETAL: Normal ROM, strength equal bilaterally, cervical paraspinous muscle spasm noted bilaterally as well as trapezius SKIN: Warm, dry without erythema  NEUROLOGICAL: Alert, oriented, CN II-XII grossly intact, Appropriate balance, No dysmetria, Sensation equal bilaterally, Romberg negative.   PSYCH: Normal behavior, mood  Records reviewed from [facility]: Pt has used Imitrex tab and injection without benefit.  Demerol only masked the pain for a short time.  Fioricet has helped.  She has used nortriptyline and pamelor for prevention previously but no note of side effects or efficacy.    Assessment & Plan:  Assessment: 1. Migraine without aura and without status migrainosus, not intractable   2. Muscle spasm      Plan: Due to pt nursing, will hold on preventive meds at this time.  Pt planning to have Mirena insertion in 10 days. This may help with HA's as well.  Pt to keep HA diary to help remember HA days, aggravating factors Anaprox rx for mild HA Maxalt rx for migraine,  Pump and dump breastmilk with Maxalt use.  Anaprox with Maxalt for improved efficacy May use Tylenol as well PRN Follow-up in 1 months or sooner PRN  Bertram Denver, PA-C 10/29/2014 9:58 AM

## 2014-11-08 ENCOUNTER — Ambulatory Visit (INDEPENDENT_AMBULATORY_CARE_PROVIDER_SITE_OTHER): Payer: Commercial Managed Care - HMO | Admitting: Advanced Practice Midwife

## 2014-11-08 DIAGNOSIS — Z01812 Encounter for preprocedural laboratory examination: Secondary | ICD-10-CM | POA: Diagnosis not present

## 2014-11-08 DIAGNOSIS — N912 Amenorrhea, unspecified: Secondary | ICD-10-CM

## 2014-11-08 DIAGNOSIS — Z30430 Encounter for insertion of intrauterine contraceptive device: Secondary | ICD-10-CM

## 2014-11-08 LAB — POCT URINE PREGNANCY: Preg Test, Ur: NEGATIVE

## 2014-11-08 NOTE — Patient Instructions (Signed)
IUD PLACEMENT POST-PROCEDURE INSTRUCTIONS  1. You may take Ibuprofen, Aleve or Tylenol for pain if needed.  Cramping should resolve within in 24 hours.  2. You may have a small amount of spotting.  You should wear a mini pad for the next few days.  3. You may have intercourse after 24 hours.  If you using this for birth control, it is effective immediately.  4. You need to call if you have any pelvic pain, fever, heavy bleeding or foul smelling vaginal discharge.  Irregular bleeding is common the first several months after having an IUD placed. You do not need to call for this reason unless you are concerned.  5. Shower or bathe as normal  6. You should have a follow-up appointment in 4-8 weeks for a re-check to make sure you are not having any problems.  Back Exercises Back exercises help treat and prevent back injuries. The goal of back exercises is to increase the strength of your abdominal and back muscles and the flexibility of your back. These exercises should be started when you no longer have back pain. Back exercises include:  Pelvic Tilt. Lie on your back with your knees bent. Tilt your pelvis until the lower part of your back is against the floor. Hold this position 5 to 10 sec and repeat 5 to 10 times.  Knee to Chest. Pull first 1 knee up against your chest and hold for 20 to 30 seconds, repeat this with the other knee, and then both knees. This may be done with the other leg straight or bent, whichever feels better.  Sit-Ups or Curl-Ups. Bend your knees 90 degrees. Start with tilting your pelvis, and do a partial, slow sit-up, lifting your trunk only 30 to 45 degrees off the floor. Take at least 2 to 3 seconds for each sit-up. Do not do sit-ups with your knees out straight. If partial sit-ups are difficult, simply do the above but with only tightening your abdominal muscles and holding it as directed.  Hip-Lift. Lie on your back with your knees flexed 90 degrees. Push down with  your feet and shoulders as you raise your hips a couple inches off the floor; hold for 10 seconds, repeat 5 to 10 times.  Back arches. Lie on your stomach, propping yourself up on bent elbows. Slowly press on your hands, causing an arch in your low back. Repeat 3 to 5 times. Any initial stiffness and discomfort should lessen with repetition over time.  Shoulder-Lifts. Lie face down with arms beside your body. Keep hips and torso pressed to floor as you slowly lift your head and shoulders off the floor. Do not overdo your exercises, especially in the beginning. Exercises may cause you some mild back discomfort which lasts for a few minutes; however, if the pain is more severe, or lasts for more than 15 minutes, do not continue exercises until you see your caregiver. Improvement with exercise therapy for back problems is slow.  See your caregivers for assistance with developing a proper back exercise program. Document Released: 03/18/2004 Document Revised: 05/03/2011 Document Reviewed: 12/10/2010 [facility] Patient Information 2015 [place], [place]. This information is not intended to replace advice given to you by your health care provider. Make sure you discuss any questions you have with your health care provider.

## 2014-11-16 NOTE — Progress Notes (Signed)
  Subjective:     Emma Walker is a 34 y.o. female who presents for a postpartum visit. She is 8 weeks postpartum following a spontaneous vaginal delivery. I have fully reviewed the prenatal and intrapartum course. The delivery was at 39.1 gestational weeks. Outcome: spontaneous vaginal delivery. Anesthesia: epidural. Postpartum course has been uncomplicated. Baby's course has been uncomplicated. Baby is feeding by both breast and bottle -  . Bleeding no bleeding. Bowel function is normal. Bladder function is normal. Patient is not sexually active. Contraception method is abstinence and Requesting placement of Mirena today. Has had one in the past.. Postpartum depression screening: negative.  The following portions of the patient's history were reviewed and updated as appropriate: allergies, current medications, past family history, past medical history, past social history, past surgical history and problem list.  Review of Systems A comprehensive review of systems was negative.   Objective:    BP 114/84 mmHg  Pulse 82  Ht  (1.575 m)  Wt 161 lb (73.029 kg)  BMI 29.44 kg/m2  General:  alert, appears stated age and no distress   Breasts:  inspection negative, no nipple discharge or bleeding, no masses or nodularity palpable  Lungs: clear to auscultation bilaterally  Heart:  regular rate and rhythm, S1, S2 normal, no murmur, click, rub or gallop  Abdomen: soft, non-tender; bowel sounds normal; no masses,  no organomegaly   Vulva:  normal  Vagina: normal vagina, no discharge, exudate, lesion, or erythema  Cervix:  multiparous appearance and no cervical motion tenderness  Corpus: normal size, contour, position, consistency, mobility, non-tender and anteverted  Adnexa:  no mass, fullness, tenderness  Rectal Exam: Not performed.        Procedure note: Patient identified, informed consent performed, signed copy in chart, time out was performed.  Urine pregnancy test  negative.  Speculum placed in the vagina.  Cervix visualized.  Cleaned with Betadine x 2.  Grasped anteriourly with a single tooth tenaculum.  Uterus sounded to 8 cm.  Mirena IUD placed per manufacturer's recommendations.  Strings trimmed to 3 cm.   Patient given post procedure instructions and Mirena care card with expiration date.  Patient is asked to check IUD strings periodically and follow up in 4-6 weeks for IUD check.   Assessment:     normal postpartum exam. Pap smear not done at today's visit.    Initial encounter for management of intrauterine device  - Mirena placed  Plan:    1. Contraception: IUD 2. Backup contraception 7 days. 3. Follow up in: 6 weeks string check or as needed.   4. Call office or go to maternity admissions for fever greater than 100.4, severe abdominal pain or severe bleeding.

## 2014-11-26 ENCOUNTER — Encounter: Payer: Self-pay | Admitting: Physician Assistant

## 2014-11-26 ENCOUNTER — Ambulatory Visit (INDEPENDENT_AMBULATORY_CARE_PROVIDER_SITE_OTHER): Payer: Commercial Managed Care - HMO | Admitting: Physician Assistant

## 2014-11-26 VITALS — BP 108/76 | HR 80 | Resp 16 | Ht 62.0 in | Wt 163.0 lb

## 2014-11-26 DIAGNOSIS — M62838 Other muscle spasm: Secondary | ICD-10-CM | POA: Insufficient documentation

## 2014-11-26 DIAGNOSIS — G43009 Migraine without aura, not intractable, without status migrainosus: Secondary | ICD-10-CM

## 2014-11-26 MED ORDER — NAPROXEN SODIUM 550 MG PO TABS: 550.0000 mg | ORAL_TABLET | Freq: Two times a day (BID) | ORAL | Status: DC

## 2014-11-26 MED ORDER — CYCLOBENZAPRINE HCL 10 MG PO TABS: 10.0000 mg | ORAL_TABLET | Freq: Three times a day (TID) | ORAL | Status: DC | PRN

## 2014-11-26 NOTE — Progress Notes (Signed)
Patient ID: Emma Walker, female   DOB: 01/03/1981, 34 y.o.   MRN: [id number] History:  IRJA WHELESS is a 34 y.o. [id number] who presents to clinic today for follow up of migraine headaches.  She reports that most often the headache builds during the day and in the evening she notices moderate to severe headaches that tend to be worse in neck and back of head.  Occasionally she awakens with them.  These morning headaches start in her face around her eyes/nose and seem to be associated with barometric change.  Anaprox has worked well for her and she has not needed to use any other medications.  Has not tried Maxalt.  She has not used Flexeril lately and forgot how to use this medication.  Mirena was inserted a few weeks ago and she feels that her headaches are improved as a result.   Still nursing but also supplementing with formula.                Number of days in the last 4 weeks with:  Severe headache: 3 Moderate headache: 4 Mild headache: 0 No headache: 21  Past Medical History  Diagnosis Date  . GERD (gastroesophageal reflux disease)   . Arm pain, right   . Headache(784.0)     migraines  . Radiculopathy 10/04/2013  . Asthma     Social History   Social History  . Marital Status: Married    Spouse Name: N/A  . Number of Children: N/A  . Years of Education: N/A   Occupational History  . Not on file.   Social History Main Topics  . Smoking status: Former Smoker -- 0.50 packs/day for 10 years    Types: Cigarettes    Quit date: 01/06/2014  . Smokeless tobacco: Never Used  . Alcohol Use: No  . Drug Use: No  . Sexual Activity: Yes    Birth Control/ Protection: None   Other Topics Concern  . Not on file   Social History Narrative    Family History  Problem Relation Age of Onset  . Asthma Mother   . Bronchiolitis Sister   . Asthma Brother   . Breast cancer Maternal Aunt   . Breast cancer Maternal Aunt   . Pancreatic cancer Maternal Grandmother   . Lung cancer  Paternal Grandfather   . Cancer Paternal Grandmother     No Known Allergies  Current Outpatient Prescriptions on File Prior to Visit  Medication Sig Dispense Refill  . albuterol (PROVENTIL HFA;VENTOLIN HFA) 108 (90 BASE) MCG/ACT inhaler Inhale 2 puffs into the lungs every 6 (six) hours as needed. For wheezing    . cyclobenzaprine (FLEXERIL) 10 MG tablet Take 10 mg by mouth.    Marland Kitchen ibuprofen (ADVIL,MOTRIN) 600 MG tablet Take 1 tablet (600 mg total) by mouth every 6 (six) hours as needed for moderate pain. Do not use after [redacted] weeks gestation. 30 tablet 1  . loratadine (CLARITIN) 10 MG tablet Take 10 mg by mouth daily.    . naproxen sodium (ANAPROX DS) 550 MG tablet Take 1 tablet (550 mg total) by mouth 2 (two) times daily with a meal. 60 tablet 0  . omeprazole (PRILOSEC) 20 MG capsule   3  . Prenatal w/o A Vit-Fe Fum-FA (VIVA CT PRENATAL) 28-1 MG CHEW Chew 2 tablets by mouth daily.    . rizatriptan (MAXALT) 10 MG tablet Take 1 tablet (10 mg total) by mouth as needed for migraine. May repeat in 2 hours if needed  10 tablet 0  . triamcinolone cream (KENALOG) 0.1 % Apply 1 application topically 2 (two) times daily. 30 g 0   No current facility-administered medications on file prior to visit.     Review of Systems:  All pertinent positive/negative included in HPI, all other review of systems are negative  Objective:  Physical Exam BP 108/76 mmHg  Pulse 80  Resp 16  Ht  (1.575 m)  Wt 163 lb (73.936 kg)  BMI 29.81 kg/m2  Breastfeeding? Yes CONSTITUTIONAL: Well-developed, well-nourished female in no acute distress.  EYES: EOM intact ENT: Normocephalic CARDIOVASCULAR: Regular rate and rhythm with no adventitious sounds.  RESPIRATORY: Normal rate. Clear to auscultation bilaterally.  ENDOCRINE: Normal thyroid.  MUSCULOSKELETAL: Normal ROM, strength equal bilaterally, bilat cervical muscle spasm noted and pt is extremely tender to palpation in these areas SKIN: Warm, dry without  erythema  NEUROLOGICAL: Alert, oriented, CN II-XII grossly intact, Appropriate balance, No dysmetria, Sensation equal bilaterally, Romberg negative.   PSYCH: Normal behavior, mood   Assessment & Plan:  Assessment: Muscle Spasm - worsening Migraine Headaches without aura - improving  Plan: Pt strongly urged to use Flexeril and/or Anaprox for the neck pain/muscle spasm.  Sedation precautions given.  Pt may break Flexeril in half if needed.  Will need to get this under better control as pt is exquisitely tender to touch.  May consider TPI if no improvement.   Migraine HA - continue to use Anaprox while HA is still mild to moderate in hopes of catching early.  Limit use to 2 days per week total.  If HA is not completely improved, she may use Maxalt to further treat.   Continue nursing as able.   No need for preventive medication at this time.   Pt aware lifestyle factors into HA however unable to make positive changes at this time due to taking care of infant. Follow-up in 6 months or sooner PRN  Bertram Denver, PA-C 11/26/2014 10:07 AM

## 2014-11-26 NOTE — Patient Instructions (Signed)

## 2014-12-20 ENCOUNTER — Ambulatory Visit: Payer: Commercial Managed Care - HMO | Admitting: Advanced Practice Midwife

## 2014-12-27 ENCOUNTER — Encounter: Payer: Self-pay | Admitting: Advanced Practice Midwife

## 2014-12-27 ENCOUNTER — Ambulatory Visit (INDEPENDENT_AMBULATORY_CARE_PROVIDER_SITE_OTHER): Payer: Commercial Managed Care - HMO | Admitting: Advanced Practice Midwife

## 2014-12-27 VITALS — BP 116/76 | HR 73 | Resp 16 | Ht 62.0 in | Wt 166.0 lb

## 2014-12-27 DIAGNOSIS — Z30431 Encounter for routine checking of intrauterine contraceptive device: Secondary | ICD-10-CM | POA: Diagnosis not present

## 2014-12-27 NOTE — Progress Notes (Signed)
   Subjective:    Patient ID: Archie EndoArkeesha M Morath, female    DOB: 06/11/1980, 34 y.o.   MRN: [id number]  HPI: Here for string check. Had IUD placed 6 weeks ago. Had light period last three days since it was placed. No other bleeding, vaginal discharge, abd pain. Has not tried to feel strings.   Concerned about weight.   Review of Systems  Neg for abd pain, VB, Vaginal discharge. Pos for weight gain.      Objective:   Physical Exam  BP 116/76 mmHg  Pulse 73  Resp 16  Ht 5\' 2"  (1.575 m)  Wt 166 lb (75.297 kg)  BMI 30.35 kg/m2  Breastfeeding? Yes General: Well-nourished female in NAD Abd: NT Pelvic. NEFG, physiologic discharge, no blood. Strings felt. Tip of IUD not felt.     Assessment & Plan:  IUD check up Discussed increasing physical activity, mom-baby yoga, walking, Zumba.  No weight loss supplement know to be safe w/ breastfeeding. F/U in 1 year or PRN  [place] Mischa Pollard, [place] 12/27/2014 11:38 AM

## 2014-12-27 NOTE — Patient Instructions (Signed)
[employer] [facility] [phone]  [facility]

## 2015-01-28 ENCOUNTER — Ambulatory Visit (INDEPENDENT_AMBULATORY_CARE_PROVIDER_SITE_OTHER): Payer: Commercial Managed Care - HMO | Admitting: Physician Assistant

## 2015-01-28 ENCOUNTER — Encounter: Payer: Self-pay | Admitting: Physician Assistant

## 2015-01-28 VITALS — BP 120/77 | HR 79 | Resp 16 | Ht 61.0 in | Wt 165.0 lb

## 2015-01-28 DIAGNOSIS — M62838 Other muscle spasm: Secondary | ICD-10-CM | POA: Diagnosis not present

## 2015-01-28 DIAGNOSIS — G43009 Migraine without aura, not intractable, without status migrainosus: Secondary | ICD-10-CM

## 2015-01-28 NOTE — Progress Notes (Signed)
Patient ID: Emma Walker, female   DOB: 05/10/1980, 34 y.o.   MRN: [id number] History:  Emma Endorkeesha M Bialas is a 34 y.o. [id number] who presents to clinic today for worsening of migraine headaches.  She has restarted school and is having to spend more time on the computer in addition to just having more responsibilities with school and a new baby.  She would like to have papers completed to be able to receive and complete assignments on paper rather than using the computer always.  Anaprox helpful for mild to moderate HA.  Acid reflux has worsened as a result of the anaprox and stress.  She takes her omeprazole daily to control.  She uses naprosyn 1-2 times per week.  She has not yet tried Maxalt.  Quality of the headaches is unchanged.    HIT6:56 Number of days in the last 4 weeks with:  Severe headache: 0 Moderate headache: 2 Mild headache: 0  No headache: 0   Past Medical History  Diagnosis Date  . GERD (gastroesophageal reflux disease)   . Arm pain, right   . Headache(784.0)     migraines  . Radiculopathy 10/04/2013  . Asthma     Social History   Social History  . Marital Status: Married    Spouse Name: N/A  . Number of Children: N/A  . Years of Education: N/A   Occupational History  . Not on file.   Social History Main Topics  . Smoking status: Former Smoker -- 0.50 packs/day for 10 years    Types: Cigarettes    Quit date: 01/06/2014  . Smokeless tobacco: Never Used  . Alcohol Use: No  . Drug Use: No  . Sexual Activity: Yes    Birth Control/ Protection: None   Other Topics Concern  . Not on file   Social History Narrative    Family History  Problem Relation Age of Onset  . Asthma Mother   . Bronchiolitis Sister   . Asthma Brother   . Breast cancer Maternal Aunt   . Breast cancer Maternal Aunt   . Pancreatic cancer Maternal Grandmother   . Lung cancer Paternal Grandfather   . Cancer Paternal Grandmother     No Known Allergies  Current Outpatient  Prescriptions on File Prior to Visit  Medication Sig Dispense Refill  . albuterol (PROVENTIL HFA;VENTOLIN HFA) 108 (90 BASE) MCG/ACT inhaler Inhale 2 puffs into the lungs every 6 (six) hours as needed. For wheezing    . cyclobenzaprine (FLEXERIL) 10 MG tablet Take 1 tablet (10 mg total) by mouth 3 (three) times daily as needed for muscle spasms. Okay to break in half 60 tablet 1  . ibuprofen (ADVIL,MOTRIN) 600 MG tablet Take 1 tablet (600 mg total) by mouth every 6 (six) hours as needed for moderate pain. Do not use after [redacted] weeks gestation. 30 tablet 1  . levonorgestrel (MIRENA) 20 MCG/24HR IUD 1 each by Intrauterine route once.    . loratadine (CLARITIN) 10 MG tablet Take 10 mg by mouth daily.    . naproxen sodium (ANAPROX DS) 550 MG tablet Take 1 tablet (550 mg total) by mouth 2 (two) times daily with a meal. 60 tablet 1  . omeprazole (PRILOSEC) 20 MG capsule   3  . Prenatal w/o A Vit-Fe Fum-FA (VIVA CT PRENATAL) 28-1 MG CHEW Chew 2 tablets by mouth daily.    . rizatriptan (MAXALT) 10 MG tablet Take 1 tablet (10 mg total) by mouth as needed for migraine. May repeat in  2 hours if needed 10 tablet 0  . triamcinolone cream (KENALOG) 0.1 % Apply 1 application topically 2 (two) times daily. 30 g 0   No current facility-administered medications on file prior to visit.     Review of Systems:  All pertinent positive/negative included in HPI, all other review of systems are negative  Objective:  Physical Exam BP 120/77 mmHg  Pulse 79  Resp 16  Ht  (1.549 m)  Wt 165 lb (74.844 kg)  BMI 31.19 kg/m2  Breastfeeding? Yes CONSTITUTIONAL: Well-developed, well-nourished female in no acute distress.  EYES: EOM intact ENT: Normocephalic CARDIOVASCULAR: Regular rate and rhythm with no adventitious sounds.  RESPIRATORY: Normal rate. Clear to auscultation bilaterally.  ENDOCRINE: Normal thyroid.  MUSCULOSKELETAL: Normal ROM, strength equal bilaterally, continued bilat cervical paraspinal muscle  spasm noted SKIN: Warm, dry without erythema  NEUROLOGICAL: Alert, oriented, CN II-XII grossly intact, Appropriate balance, No dysmetria, Sensation equal bilaterally   PSYCH: Normal behavior, mood   Assessment & Plan:  A: Stable migraine headaches  Plan: Anaprox is helpful for acute HA.   Papers completed to ask for hard copies of assignments in school to minimize screen time.  She is continuing to nurse.  Will hold on using other medications.   Follow-up in 6 months or sooner PRN  Bertram Denver, PA-C 01/28/2015 10:54 AM

## 2015-01-28 NOTE — Patient Instructions (Signed)

## 2015-08-05 ENCOUNTER — Telehealth: Payer: Self-pay | Admitting: *Deleted

## 2015-08-05 DIAGNOSIS — N898 Other specified noninflammatory disorders of vagina: Secondary | ICD-10-CM

## 2015-08-05 MED ORDER — LIDOCAINE HCL 2 % EX GEL
1.0000 | CUTANEOUS | Status: DC | PRN
Start: 2015-08-05 — End: 2017-07-26

## 2015-08-05 NOTE — Telephone Encounter (Signed)
Pt called in stating she has some vaginal irritation after laser hair treatment. Per Dr. Macon LargeAnyanwu, called in Lidocaine Jelly to pharmacy

## 2017-03-05 ENCOUNTER — Other Ambulatory Visit: Payer: Self-pay | Admitting: Orthopedic Surgery

## 2017-03-05 DIAGNOSIS — S59001K Unspecified physeal fracture of lower end of ulna, right arm, subsequent encounter for fracture with nonunion: Secondary | ICD-10-CM

## 2017-03-05 DIAGNOSIS — M5410 Radiculopathy, site unspecified: Secondary | ICD-10-CM

## 2017-03-14 ENCOUNTER — Ambulatory Visit
Admission: RE | Admit: 2017-03-14 | Discharge: 2017-03-14 | Disposition: A | Discharge status: Home / Self Care | Payer: Commercial Managed Care - HMO | Source: Ambulatory Visit | Attending: Orthopedic Surgery | Admitting: Orthopedic Surgery

## 2017-03-14 ENCOUNTER — Ambulatory Visit
Admission: RE | Admit: 2017-03-14 | Discharge: 2017-03-14 | Disposition: A | Discharge status: Home / Self Care | Payer: 59 | Source: Ambulatory Visit | Attending: Orthopedic Surgery | Admitting: Orthopedic Surgery

## 2017-03-14 DIAGNOSIS — S59001K Unspecified physeal fracture of lower end of ulna, right arm, subsequent encounter for fracture with nonunion: Secondary | ICD-10-CM

## 2017-03-14 DIAGNOSIS — M5410 Radiculopathy, site unspecified: Secondary | ICD-10-CM

## 2017-07-25 ENCOUNTER — Other Ambulatory Visit: Payer: Self-pay | Admitting: Orthopedic Surgery

## 2017-07-27 ENCOUNTER — Other Ambulatory Visit: Payer: Self-pay

## 2017-07-27 ENCOUNTER — Encounter (HOSPITAL_COMMUNITY)
Admission: RE | Admit: 2017-07-27 | Discharge: 2017-07-27 | Disposition: A | Discharge status: Home / Self Care | Payer: 59 | Source: Ambulatory Visit | Attending: Orthopedic Surgery | Admitting: Orthopedic Surgery

## 2017-07-27 ENCOUNTER — Encounter (HOSPITAL_COMMUNITY): Payer: Self-pay

## 2017-07-27 ENCOUNTER — Ambulatory Visit (HOSPITAL_COMMUNITY)
Admission: RE | Admit: 2017-07-27 | Discharge: 2017-07-27 | Disposition: A | Discharge status: Home / Self Care | Payer: 59 | Source: Ambulatory Visit | Attending: Orthopedic Surgery | Admitting: Orthopedic Surgery

## 2017-07-27 DIAGNOSIS — Z0183 Encounter for blood typing: Secondary | ICD-10-CM | POA: Diagnosis not present

## 2017-07-27 DIAGNOSIS — Z01812 Encounter for preprocedural laboratory examination: Secondary | ICD-10-CM | POA: Diagnosis not present

## 2017-07-27 DIAGNOSIS — Z01818 Encounter for other preprocedural examination: Secondary | ICD-10-CM

## 2017-07-27 HISTORY — DX: Other cervical disc degeneration, unspecified cervical region: M50.30

## 2017-07-27 LAB — URINALYSIS, ROUTINE W REFLEX MICROSCOPIC
Bilirubin Urine: NEGATIVE
Glucose, UA: NEGATIVE mg/dL
Hgb urine dipstick: NEGATIVE
Ketones, ur: NEGATIVE mg/dL
Leukocytes, UA: NEGATIVE
NITRITE: NEGATIVE
PH: 7 (ref 5.0–8.0)
Protein, ur: NEGATIVE mg/dL
Specific Gravity, Urine: 1.023 (ref 1.005–1.030)

## 2017-07-27 LAB — COMPREHENSIVE METABOLIC PANEL
ALBUMIN: 4.4 g/dL (ref 3.5–5.0)
ALK PHOS: 80 U/L (ref 38–126)
ALT: 16 U/L (ref 14–54)
ANION GAP: 9 (ref 5–15)
AST: 25 U/L (ref 15–41)
BILIRUBIN TOTAL: 0.5 mg/dL (ref 0.3–1.2)
BUN: 8 mg/dL (ref 6–20)
CALCIUM: 9.7 mg/dL (ref 8.9–10.3)
CO2: 25 mmol/L (ref 22–32)
Chloride: 105 mmol/L (ref 101–111)
Creatinine, Ser: 0.8 mg/dL (ref 0.44–1.00)
GFR calc Af Amer: >60 mL/min (ref >60)
GLUCOSE: 83 mg/dL (ref 65–99)
POTASSIUM: 3.9 mmol/L (ref 3.5–5.1)
Sodium: 139 mmol/L (ref 135–145)
Total Protein: 7.9 g/dL (ref 6.5–8.1)

## 2017-07-27 LAB — TYPE AND SCREEN
ABO/RH(D): O POS
Antibody Screen: NEGATIVE

## 2017-07-27 LAB — CBC WITH DIFFERENTIAL/PLATELET
Abs Immature Granulocytes: 0 10*3/uL (ref 0.0–0.1)
Basophils Absolute: 0.1 10*3/uL (ref 0.0–0.1)
Basophils Relative: 1 %
EOS ABS: 0.5 10*3/uL (ref 0.0–0.7)
EOS PCT: 7 %
HEMATOCRIT: 44.3 % (ref 36.0–46.0)
HEMOGLOBIN: 14.8 g/dL (ref 12.0–15.0)
Immature Granulocytes: 0 %
LYMPHS ABS: 4.1 10*3/uL — AB (ref 0.7–4.0)
LYMPHS PCT: 53 %
MCH: 32.1 pg (ref 26.0–34.0)
MCHC: 33.4 g/dL (ref 30.0–36.0)
MCV: 96.1 fL (ref 78.0–100.0)
MONOS PCT: 6 %
Monocytes Absolute: 0.5 10*3/uL (ref 0.1–1.0)
Neutro Abs: 2.5 10*3/uL (ref 1.7–7.7)
Neutrophils Relative %: 33 %
Platelets: 336 10*3/uL (ref 150–400)
RBC: 4.61 MIL/uL (ref 3.87–5.11)
RDW: 13.2 % (ref 11.5–15.5)
WBC: 7.6 10*3/uL (ref 4.0–10.5)

## 2017-07-27 LAB — SURGICAL PCR SCREEN
MRSA, PCR: NEGATIVE
Staphylococcus aureus: NEGATIVE

## 2017-07-27 LAB — PROTIME-INR
INR: 1.07
PROTHROMBIN TIME: 13.8 s (ref 11.4–15.2)

## 2017-07-27 LAB — APTT: aPTT: 37 seconds — ABNORMAL HIGH (ref 24–36)

## 2017-07-27 NOTE — Progress Notes (Signed)
PCP - Dr. Cooper RenderEverette Cardiologist - patient denies  Chest x-ray - 07/27/2017 EKG - 07/27/2017 Stress Test - patient denies ECHO - patient denies Cardiac Cath - patient denies  Sleep Study - patient denies  Anesthesia review: n/a  Patient denies shortness of breath, fever, cough and chest pain at PAT appointment   Patient verbalized understanding of instructions that were given to them at the PAT appointment. Patient was also instructed that they will need to review over the PAT instructions again at home before surgery.

## 2017-07-27 NOTE — Pre-Procedure Instructions (Signed)
Emma Walker  07/27/2017      [facility]- [place], [place] - [address] [place] [address] Phone: [phone] Fax: [phone]    Your procedure is scheduled on Wednesday 08/03/2017.  Report to [facility] Admitting at 0900 A.M.  Call this number if you have problems the morning of surgery:  [phone]   Remember:  Nothing to eat or drink after midnight the night before your surgery    Take these medicines the morning of surgery with A SIP OF WATER: Albuterol Inhaler - if needed for wheezing (Please bring with you the day of surgery) Cetirizine (Zyrtec) - if needed Methocarbamol (Robaxin) - if needed Omeprazole (Prilosec) - if needed  7 days prior to surgery STOP taking any Aspirin (unless otherwise instructed by your surgeon), Aleve, Naproxen, Ibuprofen, Motrin, Advil, Goody's, BC's, all herbal medications, fish oil, and all vitamins      Do not wear jewelry, make-up or nail polish.  Do not wear lotions, powders, or perfumes, or deodorant.  Do not shave 48 hours prior to surgery.  Do not bring valuables to the hospital.  [facility] is not responsible for any belongings or valuables.  Hearing aids, eyeglasses, contacts, dentures or bridgework may not be worn into surgery.  Leave your suitcase in the car.  After surgery it may be brought to your room.  For patients admitted to the hospital, discharge time will be determined by your treatment team.  Patients discharged the day of surgery will not be allowed to drive home.   Name and phone number of your driver:    Special instructions:   [facility]- Preparing For Surgery  Before surgery, you can play an important role. Because skin is not sterile, your skin needs to be as free of germs as possible. You can reduce the number of germs on your skin by washing with CHG (chlorahexidine gluconate) Soap before surgery.  CHG is an antiseptic cleaner  which kills germs and bonds with the skin to continue killing germs even after washing.    Oral Hygiene is also important to reduce your risk of infection.  Remember - BRUSH YOUR TEETH THE MORNING OF SURGERY WITH YOUR REGULAR TOOTHPASTE  Please do not use if you have an allergy to CHG or antibacterial soaps. If your skin becomes reddened/irritated stop using the CHG.  Do not shave (including legs and underarms) for at least 48 hours prior to first CHG shower. It is OK to shave your face.  Please follow these instructions carefully.   1. Shower the NIGHT BEFORE SURGERY and the MORNING OF SURGERY with CHG.   2. If you chose to wash your hair, wash your hair first as usual with your normal shampoo.  3. After you shampoo, rinse your hair and body thoroughly to remove the shampoo.  4. Use CHG as you would any other liquid soap. You can apply CHG directly to the skin and wash gently with a scrungie or a clean washcloth.   5. Apply the CHG Soap to your body ONLY FROM THE NECK DOWN.  Do not use on open wounds or open sores. Avoid contact with your eyes, ears, mouth and genitals (private parts). Wash Face and genitals (private parts)  with your normal soap.  6. Wash thoroughly, paying special attention to the area where your surgery will be performed.  7. Thoroughly rinse your body with warm water from the neck down.  8. DO  NOT shower/wash with your normal soap after using and rinsing off the CHG Soap.  9. Pat yourself dry with a CLEAN TOWEL.  10. Wear CLEAN PAJAMAS to bed the night before surgery, wear comfortable clothes the morning of surgery  11. Place CLEAN SHEETS on your bed the night of your first shower and DO NOT SLEEP WITH PETS.    Day of Surgery: Shower as stated above. Do not apply any deodorants/lotions.  Please wear clean clothes to the hospital/surgery center.   Remember to brush your teeth WITH YOUR REGULAR TOOTHPASTE.    Please read over the following fact sheets that  you were given.

## 2017-08-01 NOTE — Progress Notes (Signed)
Patient called with questions about taking chantix before surgery.  I explained to the patient that it is ok to take the morning of surgery but that if it makes her nauseated then she can decide if she wants to take it that morning

## 2017-08-02 NOTE — Progress Notes (Signed)
Spoke to patient on the phone about time change to surgery. Instructed patient to arrive at 0830. Patient stated she needed to figure out child care and her ride since the time change. Informed patient to arrive as soon as she could morning of surgery.

## 2017-08-03 ENCOUNTER — Ambulatory Visit (HOSPITAL_COMMUNITY): Payer: 59

## 2017-08-03 ENCOUNTER — Observation Stay (HOSPITAL_COMMUNITY)
Admission: RE | Admit: 2017-08-03 | Discharge: 2017-08-04 | Disposition: A | Discharge status: Home / Self Care | Payer: 59 | Source: Ambulatory Visit | Attending: Orthopedic Surgery | Admitting: Orthopedic Surgery

## 2017-08-03 ENCOUNTER — Ambulatory Visit (HOSPITAL_COMMUNITY): Payer: 59 | Admitting: Anesthesiology

## 2017-08-03 ENCOUNTER — Encounter (HOSPITAL_COMMUNITY): Payer: Self-pay | Admitting: Surgery

## 2017-08-03 ENCOUNTER — Other Ambulatory Visit: Payer: Self-pay

## 2017-08-03 ENCOUNTER — Encounter (HOSPITAL_COMMUNITY)
Admission: RE | Disposition: A | Discharge status: Home / Self Care | Payer: Self-pay | Source: Ambulatory Visit | Attending: Orthopedic Surgery

## 2017-08-03 DIAGNOSIS — Z791 Long term (current) use of non-steroidal anti-inflammatories (NSAID): Secondary | ICD-10-CM | POA: Diagnosis not present

## 2017-08-03 DIAGNOSIS — Z7951 Long term (current) use of inhaled steroids: Secondary | ICD-10-CM | POA: Diagnosis not present

## 2017-08-03 DIAGNOSIS — M5412 Radiculopathy, cervical region: Secondary | ICD-10-CM | POA: Diagnosis not present

## 2017-08-03 DIAGNOSIS — F17210 Nicotine dependence, cigarettes, uncomplicated: Secondary | ICD-10-CM | POA: Diagnosis not present

## 2017-08-03 DIAGNOSIS — Y838 Other surgical procedures as the cause of abnormal reaction of the patient, or of later complication, without mention of misadventure at the time of the procedure: Secondary | ICD-10-CM | POA: Diagnosis not present

## 2017-08-03 DIAGNOSIS — M960 Pseudarthrosis after fusion or arthrodesis: Principal | ICD-10-CM | POA: Insufficient documentation

## 2017-08-03 DIAGNOSIS — J45909 Unspecified asthma, uncomplicated: Secondary | ICD-10-CM | POA: Insufficient documentation

## 2017-08-03 DIAGNOSIS — K219 Gastro-esophageal reflux disease without esophagitis: Secondary | ICD-10-CM | POA: Insufficient documentation

## 2017-08-03 DIAGNOSIS — Z79899 Other long term (current) drug therapy: Secondary | ICD-10-CM | POA: Insufficient documentation

## 2017-08-03 DIAGNOSIS — Z419 Encounter for procedure for purposes other than remedying health state, unspecified: Secondary | ICD-10-CM

## 2017-08-03 DIAGNOSIS — M5410 Radiculopathy, site unspecified: Secondary | ICD-10-CM | POA: Diagnosis present

## 2017-08-03 HISTORY — PX: POSTERIOR CERVICAL FUSION/FORAMINOTOMY: SHX5038

## 2017-08-03 LAB — POCT PREGNANCY, URINE: Preg Test, Ur: NEGATIVE

## 2017-08-03 SURGERY — POSTERIOR CERVICAL FUSION/FORAMINOTOMY LEVEL 1
Anesthesia: General | Site: Spine Cervical

## 2017-08-03 MED ORDER — BUPIVACAINE-EPINEPHRINE (PF) 0.25% -1:200000 IJ SOLN
INTRAMUSCULAR | Status: AC
  Filled 2017-08-03: qty 30

## 2017-08-03 MED ORDER — DOCUSATE SODIUM 100 MG PO CAPS
100.0000 mg | ORAL_CAPSULE | Freq: Two times a day (BID) | ORAL | Status: DC
  Administered 2017-08-04: 100 mg via ORAL
  Filled 2017-08-03 (×2): qty 1

## 2017-08-03 MED ORDER — LACTATED RINGERS IV SOLN
INTRAVENOUS | Status: DC
  Administered 2017-08-03 (×2): via INTRAVENOUS

## 2017-08-03 MED ORDER — PROPOFOL 10 MG/ML IV BOLUS
INTRAVENOUS | Status: DC | PRN
  Administered 2017-08-03 (×3): 50 mg via INTRAVENOUS
  Administered 2017-08-03: 70 mg via INTRAVENOUS
  Administered 2017-08-03: 180 mg via INTRAVENOUS

## 2017-08-03 MED ORDER — SODIUM CHLORIDE 0.9% FLUSH: 3.0000 mL | Freq: Two times a day (BID) | INTRAVENOUS | Status: DC

## 2017-08-03 MED ORDER — SODIUM CHLORIDE 0.9% FLUSH: 3.0000 mL | INTRAVENOUS | Status: DC | PRN

## 2017-08-03 MED ORDER — ALBUTEROL SULFATE HFA 108 (90 BASE) MCG/ACT IN AERS: 2.0000 | INHALATION_SPRAY | Freq: Four times a day (QID) | RESPIRATORY_TRACT | Status: DC | PRN

## 2017-08-03 MED ORDER — CEFAZOLIN SODIUM-DEXTROSE 2-4 GM/100ML-% IV SOLN
2.0000 g | INTRAVENOUS | Status: AC
  Administered 2017-08-03: 2 g via INTRAVENOUS

## 2017-08-03 MED ORDER — SUGAMMADEX SODIUM 200 MG/2ML IV SOLN
INTRAVENOUS | Status: DC | PRN
  Administered 2017-08-03: 200 mg via INTRAVENOUS

## 2017-08-03 MED ORDER — LIDOCAINE 2% (20 MG/ML) 5 ML SYRINGE
INTRAMUSCULAR | Status: DC | PRN
  Administered 2017-08-03: 100 mg via INTRAVENOUS

## 2017-08-03 MED ORDER — BUPIVACAINE-EPINEPHRINE 0.25% -1:200000 IJ SOLN
INTRAMUSCULAR | Status: DC | PRN
  Administered 2017-08-03: 20 mL
  Administered 2017-08-03: 9 mL

## 2017-08-03 MED ORDER — ONDANSETRON HCL 4 MG/2ML IJ SOLN
INTRAMUSCULAR | Status: AC
Start: 2017-08-03 — End: ?
  Filled 2017-08-03: qty 2

## 2017-08-03 MED ORDER — PANTOPRAZOLE SODIUM 40 MG PO TBEC
40.0000 mg | DELAYED_RELEASE_TABLET | Freq: Every day | ORAL | Status: DC
  Administered 2017-08-04: 40 mg via ORAL
  Filled 2017-08-03: qty 1

## 2017-08-03 MED ORDER — PROMETHAZINE HCL 25 MG/ML IJ SOLN
12.5000 mg | Freq: Four times a day (QID) | INTRAMUSCULAR | Status: DC | PRN
  Administered 2017-08-03: 12.5 mg via INTRAVENOUS
  Filled 2017-08-03: qty 1

## 2017-08-03 MED ORDER — HYDROMORPHONE HCL 2 MG/ML IJ SOLN
0.3000 mg | INTRAMUSCULAR | Status: DC | PRN
  Administered 2017-08-03: 0.5 mg via INTRAVENOUS

## 2017-08-03 MED ORDER — BACITRACIN ZINC 500 UNIT/GM EX OINT
TOPICAL_OINTMENT | CUTANEOUS | Status: AC
  Filled 2017-08-03: qty 28.35

## 2017-08-03 MED ORDER — OXYCODONE HCL 5 MG/5ML PO SOLN: 5.0000 mg | Freq: Once | ORAL | Status: DC | PRN

## 2017-08-03 MED ORDER — MIDAZOLAM HCL 2 MG/2ML IJ SOLN
INTRAMUSCULAR | Status: AC
  Filled 2017-08-03: qty 2

## 2017-08-03 MED ORDER — BISACODYL 5 MG PO TBEC: 5.0000 mg | DELAYED_RELEASE_TABLET | Freq: Every day | ORAL | Status: DC | PRN

## 2017-08-03 MED ORDER — ONDANSETRON HCL 4 MG/2ML IJ SOLN
4.0000 mg | Freq: Four times a day (QID) | INTRAMUSCULAR | Status: DC | PRN
  Administered 2017-08-03: 4 mg via INTRAVENOUS
  Filled 2017-08-03: qty 2

## 2017-08-03 MED ORDER — BUPIVACAINE LIPOSOME 1.3 % IJ SUSP
20.0000 mL | INTRAMUSCULAR | Status: AC
  Administered 2017-08-03: 20 mL
  Filled 2017-08-03: qty 20

## 2017-08-03 MED ORDER — FAMOTIDINE 20 MG PO TABS
20.0000 mg | ORAL_TABLET | Freq: Once | ORAL | Status: AC
  Administered 2017-08-03: 20 mg via ORAL

## 2017-08-03 MED ORDER — CEFAZOLIN SODIUM-DEXTROSE 2-4 GM/100ML-% IV SOLN
INTRAVENOUS | Status: AC
  Filled 2017-08-03: qty 100

## 2017-08-03 MED ORDER — ESMOLOL HCL 100 MG/10ML IV SOLN
INTRAVENOUS | Status: AC
  Filled 2017-08-03: qty 10

## 2017-08-03 MED ORDER — ROCURONIUM BROMIDE 10 MG/ML (PF) SYRINGE
PREFILLED_SYRINGE | INTRAVENOUS | Status: AC
  Filled 2017-08-03: qty 10

## 2017-08-03 MED ORDER — MENTHOL 3 MG MT LOZG: 1.0000 | LOZENGE | OROMUCOSAL | Status: DC | PRN

## 2017-08-03 MED ORDER — ROCURONIUM BROMIDE 10 MG/ML (PF) SYRINGE
PREFILLED_SYRINGE | INTRAVENOUS | Status: DC | PRN
  Administered 2017-08-03: 50 mg via INTRAVENOUS
  Administered 2017-08-03 (×3): 10 mg via INTRAVENOUS

## 2017-08-03 MED ORDER — ACETAMINOPHEN 650 MG RE SUPP: 650.0000 mg | RECTAL | Status: DC | PRN

## 2017-08-03 MED ORDER — POVIDONE-IODINE 7.5 % EX SOLN
Freq: Once | CUTANEOUS | Status: DC
  Filled 2017-08-03: qty 118

## 2017-08-03 MED ORDER — FLUTICASONE PROPIONATE 50 MCG/ACT NA SUSP: 1.0000 | Freq: Every evening | NASAL | Status: DC | PRN

## 2017-08-03 MED ORDER — HEMOSTATIC AGENTS (NO CHARGE) OPTIME
TOPICAL | Status: DC | PRN
  Administered 2017-08-03: 1 via TOPICAL

## 2017-08-03 MED ORDER — CEFAZOLIN SODIUM-DEXTROSE 2-4 GM/100ML-% IV SOLN
2.0000 g | Freq: Three times a day (TID) | INTRAVENOUS | Status: AC
  Administered 2017-08-03 – 2017-08-04 (×2): 2 g via INTRAVENOUS
  Filled 2017-08-03 (×2): qty 100

## 2017-08-03 MED ORDER — THROMBIN 20000 UNITS EX SOLR
CUTANEOUS | Status: DC | PRN
  Administered 2017-08-03: 20000 [IU] via TOPICAL

## 2017-08-03 MED ORDER — PHENYLEPHRINE HCL 10 MG/ML IJ SOLN
INTRAVENOUS | Status: DC | PRN
  Administered 2017-08-03: 50 ug/min via INTRAVENOUS

## 2017-08-03 MED ORDER — DEXAMETHASONE SODIUM PHOSPHATE 10 MG/ML IJ SOLN
INTRAMUSCULAR | Status: DC | PRN
  Administered 2017-08-03: 8 mg via INTRAVENOUS

## 2017-08-03 MED ORDER — VARENICLINE TARTRATE 0.5 MG PO TABS
0.5000 mg | ORAL_TABLET | Freq: Two times a day (BID) | ORAL | Status: DC
  Filled 2017-08-03 (×4): qty 1

## 2017-08-03 MED ORDER — HYDROMORPHONE HCL 2 MG/ML IJ SOLN
INTRAMUSCULAR | Status: AC
  Filled 2017-08-03: qty 1

## 2017-08-03 MED ORDER — DEXAMETHASONE SODIUM PHOSPHATE 10 MG/ML IJ SOLN
INTRAMUSCULAR | Status: AC
  Filled 2017-08-03: qty 1

## 2017-08-03 MED ORDER — SENNOSIDES-DOCUSATE SODIUM 8.6-50 MG PO TABS: 1.0000 | ORAL_TABLET | Freq: Every evening | ORAL | Status: DC | PRN

## 2017-08-03 MED ORDER — FENTANYL CITRATE (PF) 250 MCG/5ML IJ SOLN
INTRAMUSCULAR | Status: AC
  Filled 2017-08-03: qty 5

## 2017-08-03 MED ORDER — PROMETHAZINE HCL 25 MG/ML IJ SOLN: 6.2500 mg | INTRAMUSCULAR | Status: DC | PRN

## 2017-08-03 MED ORDER — ZOLPIDEM TARTRATE 5 MG PO TABS: 5.0000 mg | ORAL_TABLET | Freq: Every evening | ORAL | Status: DC | PRN

## 2017-08-03 MED ORDER — PANTOPRAZOLE SODIUM 40 MG PO TBEC: 40.0000 mg | DELAYED_RELEASE_TABLET | Freq: Every day | ORAL | Status: DC

## 2017-08-03 MED ORDER — OXYCODONE-ACETAMINOPHEN 5-325 MG PO TABS
1.0000 | ORAL_TABLET | ORAL | Status: DC | PRN
  Administered 2017-08-03 – 2017-08-04 (×4): 2 via ORAL
  Filled 2017-08-03 (×4): qty 2

## 2017-08-03 MED ORDER — BACITRACIN ZINC 500 UNIT/GM EX OINT
TOPICAL_OINTMENT | CUTANEOUS | Status: DC | PRN
  Administered 2017-08-03: 1 via TOPICAL

## 2017-08-03 MED ORDER — PHENOL 1.4 % MT LIQD: 1.0000 | OROMUCOSAL | Status: DC | PRN

## 2017-08-03 MED ORDER — MIDAZOLAM HCL 2 MG/2ML IJ SOLN
INTRAMUSCULAR | Status: DC | PRN
  Administered 2017-08-03: 2 mg via INTRAVENOUS

## 2017-08-03 MED ORDER — SODIUM CHLORIDE 0.9 % IV SOLN: 250.0000 mL | INTRAVENOUS | Status: DC

## 2017-08-03 MED ORDER — OXYCODONE HCL 5 MG PO TABS: 5.0000 mg | ORAL_TABLET | Freq: Once | ORAL | Status: DC | PRN

## 2017-08-03 MED ORDER — ALUM & MAG HYDROXIDE-SIMETH 200-200-20 MG/5ML PO SUSP: 30.0000 mL | Freq: Four times a day (QID) | ORAL | Status: DC | PRN

## 2017-08-03 MED ORDER — LORATADINE 10 MG PO TABS: 10.0000 mg | ORAL_TABLET | Freq: Every day | ORAL | Status: DC

## 2017-08-03 MED ORDER — FENTANYL CITRATE (PF) 100 MCG/2ML IJ SOLN
INTRAMUSCULAR | Status: DC | PRN
  Administered 2017-08-03: 50 ug via INTRAVENOUS
  Administered 2017-08-03: 100 ug via INTRAVENOUS
  Administered 2017-08-03: 50 ug via INTRAVENOUS
  Administered 2017-08-03: 150 ug via INTRAVENOUS
  Administered 2017-08-03: 100 ug via INTRAVENOUS

## 2017-08-03 MED ORDER — THROMBIN 20000 UNITS EX SOLR
CUTANEOUS | Status: AC
  Filled 2017-08-03: qty 20000

## 2017-08-03 MED ORDER — FLEET ENEMA 7-19 GM/118ML RE ENEM: 1.0000 | ENEMA | Freq: Once | RECTAL | Status: DC | PRN

## 2017-08-03 MED ORDER — DIAZEPAM 5 MG PO TABS
5.0000 mg | ORAL_TABLET | Freq: Four times a day (QID) | ORAL | Status: DC | PRN
  Administered 2017-08-04 (×2): 5 mg via ORAL
  Filled 2017-08-03 (×2): qty 1

## 2017-08-03 MED ORDER — ACETAMINOPHEN 325 MG PO TABS: 650.0000 mg | ORAL_TABLET | ORAL | Status: DC | PRN

## 2017-08-03 MED ORDER — PROPOFOL 10 MG/ML IV BOLUS
INTRAVENOUS | Status: AC
  Filled 2017-08-03: qty 20

## 2017-08-03 MED ORDER — SUGAMMADEX SODIUM 200 MG/2ML IV SOLN
INTRAVENOUS | Status: AC
  Filled 2017-08-03: qty 2

## 2017-08-03 MED ORDER — ONDANSETRON HCL 4 MG PO TABS: 4.0000 mg | ORAL_TABLET | Freq: Four times a day (QID) | ORAL | Status: DC | PRN

## 2017-08-03 MED ORDER — LIDOCAINE HCL 4 % EX SOLN: CUTANEOUS | Status: DC | PRN

## 2017-08-03 MED ORDER — LIDOCAINE 2% (20 MG/ML) 5 ML SYRINGE
INTRAMUSCULAR | Status: AC
  Filled 2017-08-03: qty 5

## 2017-08-03 MED ORDER — FAMOTIDINE 20 MG PO TABS
ORAL_TABLET | ORAL | Status: AC
  Filled 2017-08-03: qty 1

## 2017-08-03 MED ORDER — ESMOLOL HCL 100 MG/10ML IV SOLN
INTRAVENOUS | Status: DC | PRN
  Administered 2017-08-03: 10 mg via INTRAVENOUS

## 2017-08-03 MED ORDER — ONDANSETRON HCL 4 MG/2ML IJ SOLN
INTRAMUSCULAR | Status: DC | PRN
  Administered 2017-08-03: 4 mg via INTRAVENOUS

## 2017-08-03 SURGICAL SUPPLY — 84 items
APL SKNCLS STERI-STRIP NONHPOA (GAUZE/BANDAGES/DRESSINGS) ×1
BENZOIN TINCTURE PRP APPL 2/3 (GAUZE/BANDAGES/DRESSINGS) ×4 IMPLANT
BIT DRILL MOUNTAINEER FIX 14 (BIT) ×1
BIT DRILL MOUNTAINEER FIX 14MM (BIT) IMPLANT
BIT DRILL MOUNTAINER FXED 12MM (DRILL) IMPLANT
BLADE CLIPPER SURG NEURO (BLADE) ×3 IMPLANT
BONE VIVIGEN FORMABLE 1.3CC (Bone Implant) IMPLANT
BUR NEURO DRILL SOFT 3.0X3.8M (BURR) ×3 IMPLANT
BUR PRESCISION 1.7 ELITE (BURR) ×3 IMPLANT
CLOSURE WOUND 1/2 X4 (GAUZE/BANDAGES/DRESSINGS) ×1
CONT SPEC 4OZ CLIKSEAL STRL BL (MISCELLANEOUS) ×3 IMPLANT
CORDS BIPOLAR (ELECTRODE) ×3 IMPLANT
COVER BACK TABLE 80X110 HD (DRAPES) ×3 IMPLANT
COVER MAYO STAND STRL (DRAPES) ×2 IMPLANT
COVER SURGICAL LIGHT HANDLE (MISCELLANEOUS) ×3 IMPLANT
DRAIN CHANNEL 15F RND FF W/TCR (WOUND CARE) ×3 IMPLANT
DRAPE C-ARM 42X72 X-RAY (DRAPES) ×3 IMPLANT
DRAPE HALF SHEET 40X57 (DRAPES) ×15 IMPLANT
DRAPE INCISE IOBAN 66X45 STRL (DRAPES) ×3 IMPLANT
DRAPE PED LAPAROTOMY (DRAPES) ×3 IMPLANT
DRAPE POUCH INSTRU U-SHP 10X18 (DRAPES) ×3 IMPLANT
DRAPE SURG 17X23 STRL (DRAPES) ×24 IMPLANT
DRILL BIT MOUNTAINEER FIX 14MM (BIT) ×3
DRILL MOUNTAINEER FIXED 12MM (DRILL) ×3
DRSG MEPILEX BORDER 4X8 (GAUZE/BANDAGES/DRESSINGS) ×3 IMPLANT
DURAPREP 26ML APPLICATOR (WOUND CARE) ×3 IMPLANT
ELECT CAUTERY BLADE 6.4 (BLADE) ×3 IMPLANT
ELECT REM PT RETURN 9FT ADLT (ELECTROSURGICAL) ×3
ELECTRODE REM PT RTRN 9FT ADLT (ELECTROSURGICAL) ×1 IMPLANT
EVACUATOR SILICONE 100CC (DRAIN) ×3 IMPLANT
FLOSEAL 5ML (HEMOSTASIS) ×2 IMPLANT
GAUZE SPONGE 4X4 12PLY STRL (GAUZE/BANDAGES/DRESSINGS) ×3 IMPLANT
GAUZE SPONGE 4X4 12PLY STRL LF (GAUZE/BANDAGES/DRESSINGS) ×2 IMPLANT
GAUZE SPONGE 4X4 16PLY XRAY LF (GAUZE/BANDAGES/DRESSINGS) ×3 IMPLANT
GLOVE BIO SURGEON STRL SZ7 (GLOVE) ×3 IMPLANT
GLOVE BIO SURGEON STRL SZ8 (GLOVE) ×3 IMPLANT
GLOVE BIOGEL PI IND STRL 8 (GLOVE) ×1 IMPLANT
GLOVE BIOGEL PI INDICATOR 8 (GLOVE) ×2
GOWN STRL REUS W/ TWL LRG LVL3 (GOWN DISPOSABLE) ×4 IMPLANT
GOWN STRL REUS W/ TWL XL LVL3 (GOWN DISPOSABLE) ×1 IMPLANT
GOWN STRL REUS W/TWL LRG LVL3 (GOWN DISPOSABLE) ×12
GOWN STRL REUS W/TWL XL LVL3 (GOWN DISPOSABLE) ×3
GRAFT BNE MATRIX VG FRMBL SM 1 (Bone Implant) IMPLANT
IV CATH 14GX2 1/4 (CATHETERS) ×3 IMPLANT
KIT BASIN OR (CUSTOM PROCEDURE TRAY) ×3 IMPLANT
KIT TURNOVER KIT B (KITS) ×3 IMPLANT
NDL HYPO 21X1.5 SAFETY (NEEDLE) IMPLANT
NDL HYPO 25GX1X1/2 BEV (NEEDLE) ×1 IMPLANT
NEEDLE HYPO 21X1.5 SAFETY (NEEDLE) ×3 IMPLANT
NEEDLE HYPO 25GX1X1/2 BEV (NEEDLE) ×3 IMPLANT
NS IRRIG 1000ML POUR BTL (IV SOLUTION) ×3 IMPLANT
PACK LAMINECTOMY ORTHO (CUSTOM PROCEDURE TRAY) ×3 IMPLANT
PAD ARMBOARD 7.5X6 YLW CONV (MISCELLANEOUS) ×6 IMPLANT
PATTIES SURGICAL .5 X.5 (GAUZE/BANDAGES/DRESSINGS) ×3 IMPLANT
PIN MAYFIELD SKULL DISP (PIN) ×3 IMPLANT
PUTTY BONE DBX 2.5 MIS (Bone Implant) ×2 IMPLANT
PUTTY DBX 1CC (Putty) ×3 IMPLANT
PUTTY DBX 1CC DEPUY (Putty) IMPLANT
ROD MOUNTAINEER 3.5X60 (Rod) ×4 IMPLANT
ROD TEMPLATE MOUNTAINEER 120MM (ROD) ×2 IMPLANT
SCREW F A 3.5X14 (Screw) ×12 IMPLANT
SCREW INNER (Screw) ×12 IMPLANT
SPONGE GAUZE 4X4 16PLY UNSTER (WOUND CARE) ×3 IMPLANT
SPONGE INTESTINAL PEANUT (DISPOSABLE) IMPLANT
SPONGE SURGIFOAM ABS GEL 100 (HEMOSTASIS) ×3 IMPLANT
STRIP CLOSURE SKIN 1/2X4 (GAUZE/BANDAGES/DRESSINGS) ×1 IMPLANT
SURGIFLO W/THROMBIN 8M KIT (HEMOSTASIS) ×3 IMPLANT
SUT MNCRL AB 4-0 PS2 18 (SUTURE) ×3 IMPLANT
SUT VIC AB 0 CT1 18XCR BRD 8 (SUTURE) ×1 IMPLANT
SUT VIC AB 0 CT1 8-18 (SUTURE) ×3
SUT VIC AB 1 CT1 18XCR BRD 8 (SUTURE) ×2 IMPLANT
SUT VIC AB 1 CT1 8-18 (SUTURE) ×6
SUT VIC AB 2-0 CT2 18 VCP726D (SUTURE) ×5 IMPLANT
SYR BULB IRRIGATION 50ML (SYRINGE) ×3 IMPLANT
SYR CONTROL 10ML LL (SYRINGE) ×9 IMPLANT
SYRINGE 20CC LL (MISCELLANEOUS) ×2 IMPLANT
TAP MOUNTAINEER 3MM (TAP) ×2 IMPLANT
TAPE CLOTH 4X10 WHT NS (GAUZE/BANDAGES/DRESSINGS) ×3 IMPLANT
TAPE CLOTH SURG 4X10 WHT LF (GAUZE/BANDAGES/DRESSINGS) ×2 IMPLANT
TOWEL OR 17X24 6PK STRL BLUE (TOWEL DISPOSABLE) ×3 IMPLANT
TOWEL OR 17X26 10 PK STRL BLUE (TOWEL DISPOSABLE) ×3 IMPLANT
TRAY FOLEY MTR SLVR 16FR STAT (SET/KITS/TRAYS/PACK) ×3 IMPLANT
WATER STERILE IRR 1000ML POUR (IV SOLUTION) ×3 IMPLANT
YANKAUER SUCT BULB TIP NO VENT (SUCTIONS) ×3 IMPLANT

## 2017-08-03 NOTE — Progress Notes (Signed)
Orthopedic Tech Progress Note Patient Details:  Emma Walker 07/20/1980 [id number]  Ortho Devices Type of Ortho Device: [place] cervical collar Ortho Device/Splint Location: OR to apply Ortho Device/Splint Interventions: Casandra DoffingOrdered       Marleigh Kaylor Craig 08/03/2017, 3:38 PM

## 2017-08-03 NOTE — Anesthesia Preprocedure Evaluation (Addendum)
Anesthesia Evaluation  Patient identified by MRN, date of birth, ID band Patient awake    Reviewed: Allergy & Precautions, NPO status , Patient's Chart, lab work & pertinent test results  Airway Mallampati: I  TM Distance: >3 FB Neck ROM: Limited    Dental no notable dental hx. (+) Teeth Intact, Dental Advisory Given Patient unable to remove right upper lip piercing. Increased risk of injury discussed.:   Pulmonary asthma (mild, controlled) , Current Smoker,    Pulmonary exam normal breath sounds clear to auscultation       Cardiovascular negative cardio ROS Normal cardiovascular exam Rhythm:Regular Rate:Normal  ECG: NSR, rate 74   Neuro/Psych  Headaches, negative psych ROS   GI/Hepatic Neg liver ROS, GERD  Controlled,  Endo/Other  negative endocrine ROS  Renal/GU negative Renal ROS     Musculoskeletal negative musculoskeletal ROS (+)   Abdominal   Peds  Hematology negative hematology ROS (+)   Anesthesia Other Findings NECK PAIN  Reproductive/Obstetrics hcg negative                         Anesthesia Physical Anesthesia Plan  ASA: II  Anesthesia Plan: General   Post-op Pain Management:    Induction: Intravenous  PONV Risk Score and Plan: 3 and Ondansetron, Midazolam and Treatment may vary due to age or medical condition  Airway Management Planned: Oral ETT and Video Laryngoscope Planned  Additional Equipment:   Intra-op Plan:   Post-operative Plan: Extubation in OR  Informed Consent: I have reviewed the patients History and Physical, chart, labs and discussed the procedure including the risks, benefits and alternatives for the proposed anesthesia with the patient or authorized representative who has indicated his/her understanding and acceptance.   Dental advisory given  Plan Discussed with: CRNA  Anesthesia Plan Comments:       Anesthesia Quick Evaluation

## 2017-08-03 NOTE — Evaluation (Signed)
Physical Therapy Evaluation Patient Details Name: Emma Walker MRN: [id number] DOB: September 10, 1980 Today's Date: 08/03/2017   History of Present Illness  Patient is a 37 yo female who is s/p POSTERIOR CERVICAL FUSION, CERVICAL 3 - CERVICAL 6  Clinical Impression  Patient seen for mobility assessment s/p cervical spine surgery. Mobility limited by nausea but overall still mobilizing well. Educated patient on precautions, mobility expectations, and safety. Patient has had previous cervical surgeries in the past, feel she will progress well with mobility. Will defer further needs to OT team. No further acute PT needs. Will sign off.     Follow Up Recommendations No PT follow up    Equipment Recommendations  None recommended by PT    Recommendations for Other Services       Precautions / Restrictions Precautions Precautions: Cervical Precaution Booklet Issued: Yes (comment) Precaution Comments: verbally reviewed Required Braces or Orthoses: Cervical Brace Cervical Brace: At all times;Hard collar      Mobility  Bed Mobility Overal bed mobility: Modified Independent             General bed mobility comments: increased time to perform but no physical assist required at this time  Transfers Overall transfer level: Needs assistance   Transfers: Sit to/from Stand Sit to Stand: Min guard         General transfer comment: min guard for safety due to nausea but no physical assist required  Ambulation/Gait Ambulation/Gait assistance: Min guard Ambulation Distance (Feet): 18 Feet Assistive device: None Gait Pattern/deviations: Step-through pattern Gait velocity: decreased   General Gait Details: slow and guarded with gait, reports some nausea at this time there fore held extensive ambulation. overall steady but min guard for safety and line management  Stairs            Wheelchair Mobility    Modified Rankin (Stroke Patients Only)       Balance Overall  balance assessment: Mild deficits observed, not formally tested                                           Pertinent Vitals/Pain Pain Assessment: Faces Faces Pain Scale: Hurts even more Pain Location: neck and right shoulder Pain Descriptors / Indicators: Aching;Constant;Operative site guarding Pain Intervention(s): Monitored during session;Limited activity within patient's tolerance;Repositioned    Home Living Family/patient expects to be discharged to:: Private residence Living Arrangements: Spouse/significant other;Children Available Help at Discharge: Family Type of Home: House       Home Layout: Able to live on main level with bedroom/bathroom Home Equipment: None      Prior Function Level of Independence: Independent               Hand Dominance   Dominant Hand: Right    Extremity/Trunk Assessment   Upper Extremity Assessment Upper Extremity Assessment: Defer to OT evaluation    Lower Extremity Assessment Lower Extremity Assessment: Overall WFL for tasks assessed    Cervical / Trunk Assessment Cervical / Trunk Assessment: (s/p cervical surgery)  Communication      Cognition Arousal/Alertness: Awake/alert Behavior During Therapy: Flat affect Overall Cognitive Status: Within Functional Limits for tasks assessed                                        General Comments  Exercises     Assessment/Plan    PT Assessment Patent does not need any further PT services  PT Problem List         PT Treatment Interventions      PT Goals (Current goals can be found in the Care Plan section)  Acute Rehab PT Goals PT Goal Formulation: All assessment and education complete, DC therapy    Frequency     Barriers to discharge        Co-evaluation               AM-PAC PT "6 Clicks" Daily Activity  Outcome Measure Difficulty turning over in bed (including adjusting bedclothes, sheets and blankets)?: A  Little Difficulty moving from lying on back to sitting on the side of the bed? : A Little Difficulty sitting down on and standing up from a chair with arms (e.g., wheelchair, bedside commode, etc,.)?: A Little Help needed moving to and from a bed to chair (including a wheelchair)?: A Little Help needed walking in hospital room?: A Little Help needed climbing 3-5 steps with a railing? : A Little 6 Click Score: 18    End of Session Equipment Utilized During Treatment: Cervical collar Activity Tolerance: Patient tolerated treatment well(limited by nausea with activity) Patient left: in bed;with call bell/phone within reach;with SCD's reapplied;with family/visitor present Nurse Communication: Mobility status PT Visit Diagnosis: Other symptoms and signs involving the nervous system (R29.898)    Time: 1610-9604: 1654-1711 PT Time Calculation (min) (ACUTE ONLY): 17 min   Charges:   PT Evaluation $PT Eval Low Complexity: 1 Low     PT G Codes:        Charlotte Crumbevon Mabrey Howland, PT DPT  Board Certified Neurologic Specialist [phone]   Fabio AsaDevon J Osceola Depaz 08/03/2017, 5:14 PM

## 2017-08-03 NOTE — Transfer of Care (Signed)
Immediate Anesthesia Transfer of Care Note  Patient: Emma Walker  Procedure(s) Performed: POSTERIOR CERVICAL FUSION, CERVICAL 3 - CERVICAL 6 WITH INSTRUMENTATION AND ALLOGRAFT.  TIME REQUESTED 3.5 HOURS (N/A Spine Cervical)  Patient Location: PACU  Anesthesia Type:General  Level of Consciousness: drowsy and responds to stimulation  Airway & Oxygen Therapy: Patient Spontanous Breathing  Post-op Assessment: Report given to RN, Post -op Vital signs reviewed and stable and Patient moving all extremities X 4  Post vital signs: Reviewed and stable  Last Vitals:  Vitals Value Taken Time  BP 131/77 08/03/2017  3:38 PM  Temp    Pulse 81 08/03/2017  3:38 PM  Resp 11 08/03/2017  3:38 PM  SpO2 100 % 08/03/2017  3:38 PM  Vitals shown include unvalidated device data.  Last Pain:  Vitals:   08/03/17 0952  TempSrc:   PainSc: 7       Patients Stated Pain Goal: 3 (08/03/17 [id number])  Complications: No apparent anesthesia complications

## 2017-08-03 NOTE — H&P (Signed)
PREOPERATIVE H&P  Chief Complaint: Neck pain  HPI: Emma JEZEWSKI is a 37 y.o. 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 Patient is s/p a previous anterior cervical fusion procedure involving C3/4, and previously C4/5 and C5/6  Patient has failed multiple forms of conservative care and continues to have pain (see office notes for additional details regarding the patient's full course of treatment)  Past Medical History:  Diagnosis Date  . Arm pain, right   . Asthma   . Degenerative disc disease, cervical   . GERD (gastroesophageal reflux disease)   . Headache(784.0)    migraines  . Radiculopathy 10/04/2013   Past Surgical History:  Procedure Laterality Date  . ANTERIOR CERVICAL DECOMP/DISCECTOMY FUSION  04/15/2011   Procedure: ANTERIOR CERVICAL DECOMPRESSION/DISCECTOMY FUSION 2 LEVELS;  Surgeon: Emilee Hero, MD;  Location: [facility] OR;  Service: Orthopedics;  Laterality: N/A;  Anterior cervical decompression fusion, cervical 4-5, cervical 5-6 with instrumentation and allograft.  . ANTERIOR CERVICAL DECOMP/DISCECTOMY FUSION N/A 10/04/2013   Procedure: ANTERIOR CERVICAL DECOMPRESSION/DISCECTOMY FUSION 1 LEVEL;  Surgeon: Emilee Hero, MD;  Location: [facility] OR;  Service: Orthopedics;  Laterality: N/A;  Anterior cervical decompression fusion cervical 3-4 with instrumentation and allograft  . COLPOSCOPY  2002  . FOOT SURGERY Left 2002  . TONSILLECTOMY  1998   Social History   Socioeconomic History  . Marital status: Married    Spouse name: Not on file  . Number of children: Not on file  . Years of education: Not on file  . Highest education level: Not on file  Occupational History  . Not on file  Social Needs  . Financial resource strain: Not on file  . Food insecurity:    Worry: Not on file    Inability: Not on file  . Transportation needs:    Medical: Not on file    Non-medical: Not on file  Tobacco Use  .  Smoking status: Current Every Day Smoker    Packs/day: 0.50    Years: 10.00    Pack years: 5.00    Types: Cigarettes    Last attempt to quit: 01/06/2014    Years since quitting: 3.5  . Smokeless tobacco: Never Used  Substance and Sexual Activity  . Alcohol use: No  . Drug use: No  . Sexual activity: Yes    Birth control/protection: IUD  Lifestyle  . Physical activity:    Days per week: Not on file    Minutes per session: Not on file  . Stress: Not on file  Relationships  . Social connections:    Talks on phone: Not on file    Gets together: Not on file    Attends religious service: Not on file    Active member of club or organization: Not on file    Attends meetings of clubs or organizations: Not on file    Relationship status: Not on file  Other Topics Concern  . Not on file  Social History Narrative  . Not on file   Family History  Problem Relation Age of Onset  . Asthma Mother   . Bronchiolitis Sister   . Asthma Brother   . Breast cancer Maternal Aunt   . Breast cancer Maternal Aunt   . Pancreatic cancer Maternal Grandmother   . Lung cancer Paternal Grandfather   . Cancer Paternal Grandmother    No Known Allergies Prior to Admission medications   Medication Sig  Start Date End Date Taking? Authorizing Provider  albuterol (PROVENTIL HFA;VENTOLIN HFA) 108 (90 BASE) MCG/ACT inhaler Inhale 2 puffs into the lungs every 6 (six) hours as needed for wheezing.    Yes [provider]  cetirizine (ZYRTEC) 10 MG tablet Take 10 mg by mouth daily as needed for allergies.   Yes [provider]  cyclobenzaprine (FLEXERIL) 10 MG tablet Take 1 tablet (10 mg total) by mouth 3 (three) times daily as needed for muscle spasms. Okay to break in half Patient taking differently: Take 10 mg by mouth at bedtime as needed for muscle spasms.  11/26/14  Yes Teague Edwena Blowlark, Karen E, PA-C  fluticasone (FLONASE) 50 MCG/ACT nasal spray Place 1 spray into both nostrils at bedtime as  needed for allergies or rhinitis.   Yes [provider]  ibuprofen (ADVIL,MOTRIN) 200 MG tablet Take 200 mg by mouth daily as needed for headache.   Yes [provider]  levonorgestrel (MIRENA) 20 MCG/24HR IUD 1 each by Intrauterine route once.   Yes [provider]  methocarbamol (ROBAXIN) 500 MG tablet Take 500 mg by mouth every 6 (six) hours as needed for muscle spasms.   Yes [provider]  omeprazole (PRILOSEC) 20 MG capsule Take 20 mg by mouth daily as needed (acid reflux).  11/04/14  Yes [provider]  varenicline (CHANTIX) 0.5 MG tablet Take 0.5 mg by mouth 2 (two) times daily.   Yes [provider]  naproxen sodium (ANAPROX DS) 550 MG tablet Take 1 tablet (550 mg total) by mouth 2 (two) times daily with a meal. Patient not taking: Reported on 07/26/2017 11/26/14   Glyn Adeeague Clark, Scot JunKaren E, PA-C  triamcinolone cream (KENALOG) 0.1 % Apply 1 application topically 2 (two) times daily. Patient not taking: Reported on 07/26/2017 03/26/14   Lesly DukesLeggett, Kelly H, MD     All other systems have been reviewed and were otherwise negative with the exception of those mentioned in the HPI and as above.  Physical Exam: There were no vitals filed for this visit.  There is no height or weight on file to calculate BMI.  General: Alert, no acute distress Cardiovascular: No pedal edema Respiratory: No cyanosis, no use of accessory musculature Skin: No lesions in the area of chief complaint Neurologic: Sensation intact distally Psychiatric: Patient is competent for consent with normal mood and affect Lymphatic: No axillary or cervical lymphadenopathy  MUSCULOSKELETAL: + pain with neck rotation  Assessment/Plan: NECK PAIN Plan for Procedure(s): POSTERIOR CERVICAL FUSION, CERVICAL 3 - CERVICAL 6 WITH INSTRUMENTATION AND ALLOGRAFT   Emilee HeroUMONSKI,Myron Lona LEONARD, MD 08/03/2017 7:35 AM

## 2017-08-03 NOTE — Anesthesia Postprocedure Evaluation (Signed)
Anesthesia Post Note  Patient: Emma Walker  Procedure(s) Performed: POSTERIOR CERVICAL FUSION, CERVICAL 3 - CERVICAL 6 WITH INSTRUMENTATION AND ALLOGRAFT.  TIME REQUESTED 3.5 HOURS (N/A Spine Cervical)     Patient location during evaluation: PACU Anesthesia Type: General Level of consciousness: awake and alert Pain management: pain level controlled Vital Signs Assessment: post-procedure vital signs reviewed and stable Respiratory status: spontaneous breathing, nonlabored ventilation, respiratory function stable and patient connected to nasal cannula oxygen Cardiovascular status: blood pressure returned to baseline and stable Postop Assessment: no apparent nausea or vomiting Anesthetic complications: no    Last Vitals:  Vitals:   08/03/17 1646 08/03/17 2001  BP: 124/70 130/79  Pulse: 73 78  Resp: 17 18  Temp: 36.6 C 36.4 C  SpO2: 99% 100%    Last Pain:  Vitals:   08/03/17 2126  TempSrc:   PainSc: 5                  Ryan P Ellender

## 2017-08-03 NOTE — Op Note (Signed)
NAME: ZIANNE, SCHUBRING MEDICAL RECORD ZO:[id number] ACCOUNT [id number] DATE OF BIRTH:[birth year] FACILITY: [facility] PHYSICIAN:Gatlin Kittell Noreene Larsson, MD  OPERATIVE REPORT  DATE OF PROCEDURE:  08/03/2017  PREOPERATIVE DIAGNOSES: 1.  Status post previous C4-C6 anterior cervical diskectomy.  2.  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 3.  Severe neck pain.  POSTOPERATIVE DIAGNOSES: 1.  Status post previous C4-C6 anterior cervical diskectomy.  2.  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 3.  Severe neck pain.  PROCEDURE: 1.  Posterior spinal fusion C3-C4, C4-C5, C5-C6. 2.  Placement of posterior segmental instrumentation C3-C6. 3.  Use of local autograft. 4.  Use of morselized allograft--DBX mix, DBX putty. 5.  Cranial tong application and removal. 6.  Intraoperative use of fluoroscopy.  SURGEON:  Estill Bamberg, MD  ASSISTANT:  Jason Coop, PA-C   ANESTHESIA:  General endotracheal anesthesia.  COMPLICATIONS:  None.  DISPOSITION:  Stable.  ESTIMATED BLOOD LOSS:  Minimal.  INDICATIONS FOR SURGERY:  Briefly, the patient is a very pleasant 37 year old female who is status post the surgery as noted above.  The patient did subsequently have ongoing and substantial pain in her neck.  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  Given her ongoing pain and nonunion, we did discuss proceeding with the procedure reflected above.  The patient was fully aware of the risks and limitations of surgery and did wish to proceed.  OPERATIVE DETAILS:  On 08/03/2017, the patient was brought to surgery and general endotracheal anesthesia was administered.  A [place] head holder was applied by me.  The patient was then rolled prone, and the [place] headholder was secured to the bed  with the head positioned into the appropriate degree of lordosis.  The  patient's arms were secured to her sides, and all bony prominences were padded.  The neck was then prepped and draped posteriorly in the usual fashion.  A timeout procedure was  performed.  At this point, I did make a midline incision.  The fascia was incised at the midline.  The paraspinal musculature was bluntly swept laterally, and the posterior elements of C3, C4, C5, and C6 were identified and subperiosteally exposed.  The  facet joints to be fused were subperiosteally exposed as well.  I did use a 1.7 mm high-speed burr to decorticate the facet joints at C3-C4, C4-C5, and C5-C6 bilaterally.  A 3 mm bur was used to decorticate the posterior elements across these levels as  well.  Using anatomic landmarks, I did use a 1.7 mm bur to prepare the entry point of the lateral mass screws at C3, C5, and C6 bilaterally.  A 3 mm tap was used, and 3.5 x 12 mm screws were secured into the lateral masses of C3, C5, and C6 bilaterally.   A combination of DBX putty and DBX mix was then placed along the posterior elements and facet joints to be fused.  Autograft taken from the spinous processes was also packed posteriorly.  A rod was then contoured into the appropriate degree of lordosis  and secured into the tulip heads of the screws bilaterally.  Caps were then placed, and a final locking procedure was performed.  Prior to placing the bone graft, I did liberally irrigate the wound with a total of approximately 2 L of normal saline.  I  did use AP and lateral fluoroscopy to ensure  appropriate positioning of the screws, which I was very happy with.  At this point, the wound was closed in layers using #1 Vicryl, followed by 2-0 Vicryl, followed by 4-0 Monocryl.  Benzoin and Steri-Strips  were applied, followed by sterile dressing.  All instrument counts were correct at the termination of the procedure.  Of note, Jason CoopKayla McKenzie was my assistant throughout surgery and did aid in retraction, suctioning, and closure from  start to finish.  Estill BambergMark Moncerrath Berhe, MD  LN/NUANCE  D:08/03/2017 T:08/03/2017 JOB:[id number]

## 2017-08-03 NOTE — Anesthesia Procedure Notes (Signed)
Procedure Name: Intubation Date/Time: 08/03/2017 12:44 PM Performed by: Leonides GrillsEllender, Ryan P, MD Pre-anesthesia Checklist: Patient identified, Emergency Drugs available, Suction available and Patient being monitored Patient Re-evaluated:Patient Re-evaluated prior to induction Oxygen Delivery Method: Circle System Utilized Preoxygenation: Pre-oxygenation with 100% oxygen Induction Type: IV induction Ventilation: Mask ventilation without difficulty Laryngoscope Size: Glidescope and 4 Grade View: Grade I Tube type: Oral Tube size: 7.0 mm Number of attempts: 1 Airway Equipment and Method: Stylet and Oral airway Placement Confirmation: ETT inserted through vocal cords under direct vision,  positive ETCO2 and breath sounds checked- equal and bilateral Secured at: 22 cm Tube secured with: Tape Dental Injury: Teeth and Oropharynx as per pre-operative assessment  Comments: Performed by Joie BimlerJonathan Mayes, SRNA

## 2017-08-04 DIAGNOSIS — M960 Pseudarthrosis after fusion or arthrodesis: Secondary | ICD-10-CM | POA: Diagnosis not present

## 2017-08-04 MED ORDER — OXYCODONE-ACETAMINOPHEN 5-325 MG PO TABS: 1.0000 | ORAL_TABLET | ORAL | 0 refills | Status: AC | PRN

## 2017-08-04 MED ORDER — DIAZEPAM 5 MG PO TABS: 5.0000 mg | ORAL_TABLET | Freq: Four times a day (QID) | ORAL | 0 refills | Status: AC | PRN

## 2017-08-04 NOTE — Progress Notes (Signed)
OT Treatment Note    08/04/17 1200  OT Visit Information  Last OT Received On 07/28/17  Assistance Needed +1  History of Present Illness Patient is a 37 yo female who is s/p POSTERIOR CERVICAL FUSION, CERVICAL 3 - CERVICAL 6. 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 Patient is s/p a previous anterior cervical fusion procedure involving C3/4, and previously C4/5 and C5/6  Precautions  Precautions Cervical  Precaution Comments verbally reviewed with husband  Required Braces or Orthoses Cervical Brace  Cervical Brace At all times;Hard collar  Pain Assessment  Pain Assessment Faces  Faces Pain Scale 4  Pain Location neck and right shoulder  Pain Descriptors / Indicators Aching;Constant;Operative site guarding  Pain Intervention(s) Limited activity within patient's tolerance  Cognition  Arousal/Alertness Awake/alert  Behavior During Therapy WFL for tasks assessed/performed  Overall Cognitive Status Within Functional Limits for tasks assessed  Upper Extremity Assessment  Upper Extremity Assessment Generalized weakness  Lower Extremity Assessment  Lower Extremity Assessment Defer to PT evaluation  ADL  General ADL Comments Completed education with husband regarding cervical precautions, ADL, functional mobility adn donning/doffing cervical braces adn use of shower collar. Husband verbalized understanding. Educated on need for initial S with shower tranfer.   Transfers  General transfer comment S due to continuing to feel "dizzy" at times; Husband states he feels comfortable assisting pt to get into the house  General Comments  General comments (skin integrity, edema, etc.) Pt given blue tubing to work on grip adn pinch strength. Pt able to return demonstrate  OT - End of Session  Equipment Utilized During Treatment Cervical collar  Activity Tolerance Patient tolerated treatment well  Patient left in bed;with family/visitor present  Nurse Communication Other (comment) (Ready for DC)  OT  Assessment/Plan  OT Plan All goals met and education completed, patient discharged from OT services  OT Visit Diagnosis Unsteadiness on feet (R26.81);Muscle weakness (generalized) (M62.81);Pain  Pain - Right/Left Right  Pain - part of body Shoulder;Arm  Follow Up Recommendations No OT follow up;Supervision - Intermittent  OT Equipment None recommended by OT  AM-PAC OT "6 Clicks" Daily Activity Outcome Measure  Help from another person eating meals? 4  Help from another person taking care of personal grooming? 4  Help from another person toileting, which includes using toliet, bedpan, or urinal? 4  Help from another person bathing (including washing, rinsing, drying)? 3  Help from another person to put on and taking off regular upper body clothing? 3  Help from another person to put on and taking off regular lower body clothing? 3  6 Click Score 21  ADL G Code Conversion CJ  OT Goal Progression  Progress towards OT goals Goals met/education completed, patient discharged from OT  Acute Rehab OT Goals  Patient Stated Goal to get better  OT Goal Formulation With patient  Time For Goal Achievement 08/11/17  Potential to Achieve Goals Good  OT Time Calculation  OT Start Time (ACUTE ONLY) 1137  OT Stop Time (ACUTE ONLY) 1147  OT Time Calculation (min) 10 min  OT General Charges  $OT Visit 1 Visit  OT Treatments  $Self Care/Home Management  8-22 mins  Maurie Boettcher, OT/L  OT Clinical Specialist [phone]

## 2017-08-04 NOTE — Progress Notes (Signed)
    Patient doing well  + expected neck pain Patient denies arm pain   Physical Exam: Vitals:   08/03/17 2335 08/04/17 0437  BP: 122/86 119/72  Pulse: 70 73  Resp: 18 18  Temp: 98.1 F (36.7 C) 98.8 F (37.1 C)  SpO2: 100% 100%    Dressing in place NVI  POD #1 s/p C3-6 PSF, doing well  - encourage ambulation - Percocet for pain, Valium for muscle spasms - d/c home today with f/u in 2 weeks

## 2017-08-04 NOTE — Progress Notes (Signed)
Pt doing well. Pt and husband given D/C instructions with Rx's, verbal understanding was provided. Pt's incision is clean and dry with no sign of infection. Pt's IV was removed prior to D/C. Pt D/C'd home via wheelchair @ 1250 per MD order. Pt is stable @ D/C and has no other needs at this time. Rema FendtAshley Brytni Dray, RN

## 2017-08-04 NOTE — Progress Notes (Signed)
Occupational Therapy Evaluation Patient Details Name: Emma Walker MRN: [id number] DOB: 05/19/1980 Today's Date: 08/04/2017    History of Present Illness Patient is a 37 yo female who is s/p POSTERIOR CERVICAL FUSION, CERVICAL 3 - CERVICAL 6. 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 Educated pt on compensatory techniques for ADL and functional mobility for ADL regarding cervical precautions, in addition to managing cervical collars. Pt asking for OT to return to educate family on precautions prior to DC. OT will return if needed to facilitate safe DC home. Nsg to page OT if needed. Pt complaining of dizziness when standing. systolic BP 109 sitting and 125 standing. Nsg aware. Encouraged pt to walk with staff after she completes breakfast. Pt verbalized understanding.     Follow Up Recommendations  No OT follow up;Supervision - Intermittent    Equipment Recommendations  None recommended by OT    Recommendations for Other Services       Precautions / Restrictions Precautions Precautions: Cervical Precaution Booklet Issued: Yes (comment) Precaution Comments: verbally reviewed Required Braces or Orthoses: Cervical Brace Cervical Brace: At all times;Hard collar(Has [place] collar for showers) Restrictions Weight Bearing Restrictions: No      Mobility Bed Mobility Overal bed mobility: Needs Assistance Bed Mobility: Rolling;Sidelying to Sit Rolling: Supervision Sidelying to sit: Min guard       General bed mobility comments: increased time; educated on proper technique; pt reports she has been getting OOB with head elevated  Transfers Overall transfer level: Needs assistance   Transfers: Sit to/from Stand Sit to Stand: Min guard         General  transfer comment: min guard for safety due to "dizziness" but no physical assist required    Balance Overall balance assessment: Mild deficits observed, not formally tested                                         ADL either performed or assessed with clinical judgement   ADL Overall ADL's : Needs assistance/impaired     Grooming: Supervision/safety;Standing   Upper Body Bathing: Set up;Sitting   Lower Body Bathing: Min guard;Sit to/from stand   Upper Body Dressing : Supervision/safety;Sitting   Lower Body Dressing: Set up;Supervision/safety;Sit to/from stand   Toilet Transfer: Min guard;Ambulation;Regular Toilet   Toileting- ArchitectClothing Manipulation and Hygiene: Supervision/safety;Set up       Functional mobility during ADLs: Min guard General ADL Comments: Educated pt on cervical precautions for ADL and compensatory techniques and use of reacher to assist with IADL tasks; REcommend that pt sit as much as possible to reduce risk of falls when bathing/dressing adn to have her husband close by when showering. Educated pt on donning/doffing collar and switching out collar for phil[place] collar when bathing. Pt able to return demonstrate. Educated pt on compensatory technqiues regarding child care. Recommend using stool lwhen helping 37 yr old with toileitng; pt reports her older child will assist as well.      Vision         Perception     Praxis      Pertinent Vitals/Pain Pain Assessment: 0-10 Pain Score: 7  Pain Location: neck and right shoulder Pain Descriptors /  Indicators: Aching;Constant;Operative site guarding Pain Intervention(s): Limited activity within patient's tolerance;Repositioned;Ice applied     Hand Dominance Right   Extremity/Trunk Assessment Upper Extremity Assessment Upper Extremity Assessment: Generalized weakness(R upper trap pain/tightness)   Lower Extremity Assessment Lower Extremity Assessment: Defer to PT evaluation    Cervical / Trunk Assessment Cervical / Trunk Assessment: Other exceptions(s/p cervical surgery)   Communication Communication Communication: No difficulties   Cognition Arousal/Alertness: Awake/alert Behavior During Therapy: WFL for tasks assessed/performed Overall Cognitive Status: Within Functional Limits for tasks assessed                                     General Comments       Exercises     Shoulder Instructions      Home Living Family/patient expects to be discharged to:: Private residence Living Arrangements: Spouse/significant other;Children Available Help at Discharge: Family Type of Home: House       Home Layout: Able to live on main level with bedroom/bathroom     Bathroom Shower/Tub: Tub/shower unit;Curtain   Firefighter: Standard Bathroom Accessibility: Yes How Accessible: Accessible via walker Home Equipment: None          Prior Functioning/Environment Level of Independence: Independent        Comments: has 3 children        OT Problem List: Decreased knowledge of use of DME or AE;Decreased knowledge of precautions;Pain      OT Treatment/Interventions: Self-care/ADL training;DME and/or AE instruction;Therapeutic activities;Patient/family education    OT Goals(Current goals can be found in the care plan section) Acute Rehab OT Goals Patient Stated Goal: to get better OT Goal Formulation: With patient Time For Goal Achievement: 08/11/17 Potential to Achieve Goals: Good  OT Frequency: Min 2X/week   Barriers to D/C:            Co-evaluation              AM-PAC PT "6 Clicks" Daily Activity     Outcome Measure Help from another person eating meals?: None Help from another person taking care of personal grooming?: A Little Help from another person toileting, which includes using toliet, bedpan, or urinal?: A Little Help from another person bathing (including washing, rinsing, drying)?: A Little Help from  another person to put on and taking off regular upper body clothing?: A Little Help from another person to put on and taking off regular lower body clothing?: A Little 6 Click Score: 19   End of Session Equipment Utilized During Treatment: Gait belt;Cervical collar Nurse Communication: Mobility status  Activity Tolerance: Patient tolerated treatment well Patient left: in chair;with call bell/phone within reach  OT Visit Diagnosis: Unsteadiness on feet (R26.81);Muscle weakness (generalized) (M62.81);Pain Pain - Right/Left: Right Pain - part of body: Shoulder;Arm                Time: 1610-9604 OT Time Calculation (min): 37 min Charges:  OT General Charges $OT Visit: 1 Visit OT Evaluation $OT Eval Low Complexity: 1 Low OT Treatments $Self Care/Home Management : 8-22 mins G-Codes:     Luisa Dago, OT/L  OT Clinical Specialist [phone]   [facility] 08/04/2017, 9:53 AM

## 2017-08-05 MED FILL — Heparin Sodium (Porcine) Inj 1000 Unit/ML: INTRAMUSCULAR | Qty: 30 | Status: AC

## 2017-08-05 MED FILL — Sodium Chloride IV Soln 0.9%: INTRAVENOUS | Qty: 1000 | Status: AC

## 2017-08-08 ENCOUNTER — Encounter (HOSPITAL_COMMUNITY): Payer: Self-pay | Admitting: Orthopedic Surgery

## 2017-08-18 NOTE — Discharge Summary (Signed)
Patient ID: Emma Walker MRN: [id number] DOB/AGE: October 12, 1980 37 y.o.  Admit date: 08/03/2017 Discharge date: 08/04/2017  Admission Diagnoses:  Active Problems:   Radiculopathy   Discharge Diagnoses:  Same  Past Medical History:  Diagnosis Date  . Arm pain, right   . Asthma   . Degenerative disc disease, cervical   . GERD (gastroesophageal reflux disease)   . Headache(784.0)    migraines  . Radiculopathy 10/04/2013    Surgeries: Procedure(s): POSTERIOR CERVICAL FUSION, CERVICAL 3 - CERVICAL 6 WITH INSTRUMENTATION AND ALLOGRAFT.  TIME REQUESTED 3.5 HOURS on 08/03/2017   Consultants: None  Discharged Condition: Improved  Hospital Course: Emma Walker is an 37 y.o. female who was admitted 08/03/2017 for operative treatment of radiculopathy. Patient has severe unremitting pain that affects sleep, daily activities, and work/hobbies. After pre-op clearance the patient was taken to the operating room on 08/03/2017 and underwent  Procedure(s): POSTERIOR CERVICAL FUSION, CERVICAL 3 - CERVICAL 6 WITH INSTRUMENTATION AND ALLOGRAFT.  TIME REQUESTED 3.5 HOURS.    Patient was given perioperative antibiotics:  Anti-infectives (From admission, onward)   Start     Dose/Rate Route Frequency Ordered Stop   08/03/17 2100  ceFAZolin (ANCEF) IVPB 2g/100 mL premix     2 g 200 mL/hr over 30 Minutes Intravenous Every 8 hours 08/03/17 1642 08/04/17 0530   08/03/17 0945  ceFAZolin (ANCEF) IVPB 2g/100 mL premix     2 g 200 mL/hr over 30 Minutes Intravenous On call to O.R. 08/03/17 0931 08/03/17 1247   08/03/17 0936  ceFAZolin (ANCEF) 2-4 GM/100ML-% IVPB    Note to Pharmacy:  Lorenda Ishihara   : cabinet override      08/03/17 0936 08/03/17 1247       Patient was given sequential compression devices, early ambulation to prevent DVT.  Patient benefited maximally from hospital stay and there were no complications.    Recent vital signs: BP 113/72 (BP Location: Left Arm)   Pulse 91    Temp 98.4 F (36.9 C) (Oral)   Resp 18   Ht 5\' 2"  (1.575 m)   Wt 66.7 kg (147 lb)   SpO2 100%   BMI 26.89 kg/m   Discharge Medications:   Allergies as of 08/04/2017   No Known Allergies     Medication List    TAKE these medications   albuterol 108 (90 Base) MCG/ACT inhaler Commonly known as:  PROVENTIL HFA;VENTOLIN HFA Inhale 2 puffs into the lungs every 6 (six) hours as needed for wheezing.   cetirizine 10 MG tablet Commonly known as:  ZYRTEC Take 10 mg by mouth daily as needed for allergies.   diazepam 5 MG tablet Commonly known as:  VALIUM Take 1 tablet (5 mg total) by mouth every 6 (six) hours as needed for muscle spasms.   fluticasone 50 MCG/ACT nasal spray Commonly known as:  FLONASE Place 1 spray into both nostrils at bedtime as needed for allergies or rhinitis.   levonorgestrel 20 MCG/24HR IUD Commonly known as:  MIRENA 1 each by Intrauterine route once.   omeprazole 20 MG capsule Commonly known as:  PRILOSEC Take 20 mg by mouth daily as needed (acid reflux).   oxyCODONE-acetaminophen 5-325 MG tablet Commonly known as:  PERCOCET/ROXICET Take 1-2 tablets by mouth every 4 (four) hours as needed for moderate pain or severe pain.   triamcinolone cream 0.1 % Commonly known as:  KENALOG Apply 1 application topically 2 (two) times daily.   varenicline 0.5 MG tablet Commonly known as:  CHANTIX Take 0.5 mg by mouth 2 (two) times daily.       Diagnostic Studies: Dg Chest 2 View  Result Date: 07/28/2017 CLINICAL DATA:  Pre posterior cervical fusion evaluation.  Smoker. EXAM: CHEST - 2 VIEW COMPARISON:  09/27/2013. FINDINGS: Normal sized heart. Clear lungs. Minimal peribronchial thickening. Normal appearing bones. IMPRESSION: Minimal chronic bronchitic changes. Electronically Signed   By: Beckie SaltsSteven  Reid M.D.   On: 07/28/2017 08:52   Dg Cervical Spine 1 View  Result Date: 08/03/2017 CLINICAL DATA:  Posterior cervical fusion C3 through C6 EXAM: DG CERVICAL SPINE -  1 VIEW COMPARISON:  Cervical MRI 03/14/2017 FINDINGS: Lateral localizer in the operating room. Anterior fusion C3 through C6. Tissue spreaders posteriorly overlying the C5 spinous process. Clamp on the C5 spinous process. IMPRESSION: C5 spinous process localized in the operating room. Electronically Signed   By: Marlan Palauharles  Clark M.D.   On: 08/03/2017 15:09   Dg Cervical Spine 2-3 Views  Result Date: 08/03/2017 CLINICAL DATA:  37 year old female undergoing posterior cervical fusion. EXAM: DG C-ARM 61-120 MIN; CERVICAL SPINE - 2-3 VIEW COMPARISON:  Previous intraoperative images 81315. FINDINGS: 2 intraoperative fluoroscopic lateral images of the cervical spine. Previous C3-C4 through C5-C6 ACDF hardware with addition of posterior hardware using laminar screws at the C3, C5, and C6 levels. Chronic endplate spurring at C6-C7. FLUOROSCOPY TIME:  0 minutes 14 seconds IMPRESSION: Posterior spinal fusion hardware added to chronic ACDF. Electronically Signed   By: Odessa FlemingH  Hall M.D.   On: 08/03/2017 15:14   Dg C-arm 1-60 Min  Result Date: 08/03/2017 CLINICAL DATA:  37 year old female undergoing posterior cervical fusion. EXAM: DG C-ARM 61-120 MIN; CERVICAL SPINE - 2-3 VIEW COMPARISON:  Previous intraoperative images 81315. FINDINGS: 2 intraoperative fluoroscopic lateral images of the cervical spine. Previous C3-C4 through C5-C6 ACDF hardware with addition of posterior hardware using laminar screws at the C3, C5, and C6 levels. Chronic endplate spurring at C6-C7. FLUOROSCOPY TIME:  0 minutes 14 seconds IMPRESSION: Posterior spinal fusion hardware added to chronic ACDF. Electronically Signed   By: Odessa FlemingH  Hall M.D.   On: 08/03/2017 15:14    Disposition:    POD #1 s/p C3-6 PSF, doing well  - encourage ambulation - Percocet for pain, Valium for muscle spasms - Written scripts for pain signed and in chart - D/C instructions sheet printed and in chart - D/C today  - F/U in office 2 weeks   Signed: Georga BoraMCKENZIE, Tatayana Beshears  J 08/18/2017, 7:38 AM

## 2021-06-22 ENCOUNTER — Ambulatory Visit: Payer: 59 | Admitting: Obstetrics & Gynecology

## 2021-06-22 ENCOUNTER — Encounter: Payer: Self-pay | Admitting: Obstetrics & Gynecology

## 2021-06-22 VITALS — BP 115/77 | HR 85 | Ht 62.0 in | Wt 154.0 lb

## 2021-06-22 DIAGNOSIS — Z3200 Encounter for pregnancy test, result unknown: Secondary | ICD-10-CM

## 2021-06-22 DIAGNOSIS — N6452 Nipple discharge: Secondary | ICD-10-CM

## 2021-06-22 DIAGNOSIS — R110 Nausea: Secondary | ICD-10-CM

## 2021-06-22 DIAGNOSIS — Z30431 Encounter for routine checking of intrauterine contraceptive device: Secondary | ICD-10-CM | POA: Diagnosis not present

## 2021-06-22 DIAGNOSIS — Z3189 Encounter for other procreative management: Secondary | ICD-10-CM | POA: Diagnosis not present

## 2021-06-22 DIAGNOSIS — Z3202 Encounter for pregnancy test, result negative: Secondary | ICD-10-CM | POA: Diagnosis not present

## 2021-06-22 LAB — POCT URINE PREGNANCY: Preg Test, Ur: NEGATIVE

## 2021-06-22 NOTE — Progress Notes (Signed)
Requests UPT and IUD check ?

## 2021-06-22 NOTE — Progress Notes (Signed)
? ?  Subjective:  ? ? Patient ID: Emma Walker, female    DOB: May 28, 1980, 41 y.o.   MRN: [id number] ? ?HPI ? ?41 year old female presents for symptoms of pregnancy including breast discharge and nausea.  She had this earlier in April.  She did have a normal menstrual cycle.  She has a Mirena IUD that has been in approximately 6 years.  She is considering have another child.  She is a G5 para 3-1-1-3.  Her husband also has 2 children.  She feels that her reproductive potential is waning and she is struggling with the decision to have another child or not.  Her husband is about to retire from the [employer] at age 23 which is factoring into the decision. ? ?Review of Systems  ?Constitutional: Negative.   ?Respiratory: Negative.    ?Cardiovascular: Negative.   ?Gastrointestinal: Negative.   ?Genitourinary: Negative.   ? ?   ?Objective:  ? Physical Exam ?Vitals reviewed.  ?Constitutional:   ?   General: She is not in acute distress. ?   Appearance: She is well-developed.  ?HENT:  ?   Head: Normocephalic and atraumatic.  ?Eyes:  ?   Conjunctiva/sclera: Conjunctivae normal.  ?Cardiovascular:  ?   Rate and Rhythm: Normal rate.  ?Pulmonary:  ?   Effort: Pulmonary effort is normal.  ?Abdominal:  ?   General: Abdomen is flat.  ?   Palpations: Abdomen is soft.  ?Genitourinary: ?   Comments: Tanner V ?Vulva:  No lesion ?Vagina:  Pink, no lesions, no discharge, no blood, iud strings seen ?Cervix:  No CMT ?Uterus:  Non tender, mobile ?Right adnexa--non tender, no mass ?Left adnexa--non tender, no mass ? ? ?Skin: ?   General: Skin is warm and dry.  ?Neurological:  ?   Mental Status: She is alert and oriented to person, place, and time.  ?Psychiatric:     ?   Mood and Affect: Mood normal.  ? ?Vitals:  ? 06/22/21 1055  ?BP: 115/77  ?Pulse: 85  ?Weight: 154 lb (69.9 kg)  ?Height: 5\' 2"  (1.575 m)  ? ? ?   ?Assessment & Plan:  ?41 year old female G5 para 3-1-1-3 who wants to confirm nonpregnant status and  discussion of future childbearing. ? ?UPT is negative. ?IUD appears to be in the correct position given exam and string length. ?We discussed having a baby at 82.  If considering childbearing in the future, I suggest that she take a prenatal vitamin or a vitamin with [id number] mcg of folic acid.  She also was reassured that there is an immediate return to fertility when the IUD is removed. ?23 minutes were spent in the patient's encounter.  This includes review of records, history and physical, review of UPT, counseling, and documentation. ? ?
